# Patient Record
Sex: Female | Born: 1975 | Race: White | Hispanic: No | Smoking: Never smoker
Health system: Southern US, Community
[De-identification: ages and names within clinical notes are randomized; demographics above are authoritative.]

## PROBLEM LIST (undated history)

## (undated) DIAGNOSIS — F988 Other specified behavioral and emotional disorders with onset usually occurring in childhood and adolescence: Secondary | ICD-10-CM

## (undated) DIAGNOSIS — N390 Urinary tract infection, site not specified: Secondary | ICD-10-CM

## (undated) HISTORY — DX: Urinary tract infection, site not specified: N39.0

## (undated) HISTORY — DX: Other specified behavioral and emotional disorders with onset usually occurring in childhood and adolescence: F98.8

---
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
## 1999-01-18 ENCOUNTER — Encounter: Payer: Self-pay | Admitting: Urology

## 1999-01-18 ENCOUNTER — Encounter: Admission: RE | Admit: 1999-01-18 | Discharge: 1999-01-18 | Payer: Self-pay | Admitting: Urology

## 2002-03-04 HISTORY — PX: OTHER SURGICAL HISTORY: SHX169

## 2003-03-05 HISTORY — PX: APPENDECTOMY: SHX54

## 2007-03-05 HISTORY — PX: OTHER SURGICAL HISTORY: SHX169

## 2009-01-02 ENCOUNTER — Ambulatory Visit: Payer: Self-pay | Admitting: Family Medicine

## 2009-01-03 LAB — CONVERTED CEMR LAB
ALT: 12 units/L (ref 0–35)
AST: 14 units/L (ref 0–37)
Albumin: 4.3 g/dL (ref 3.5–5.2)
Alkaline Phosphatase: 32 units/L — ABNORMAL LOW (ref 39–117)
BUN: 14 mg/dL (ref 6–23)
CO2: 26 meq/L (ref 19–32)
Calcium: 9.2 mg/dL (ref 8.4–10.5)
Chloride: 106 meq/L (ref 96–112)
Cholesterol: 157 mg/dL (ref 0–200)
Creatinine, Ser: 0.66 mg/dL (ref 0.40–1.20)
Glucose, Bld: 81 mg/dL (ref 70–99)
HDL: 59 mg/dL (ref >39)
LDL Cholesterol: 87 mg/dL (ref 0–99)
Potassium: 3.7 meq/L (ref 3.5–5.3)
Sodium: 141 meq/L (ref 135–145)
Total Bilirubin: 0.6 mg/dL (ref 0.3–1.2)
Total CHOL/HDL Ratio: 2.7
Total Protein: 7.1 g/dL (ref 6.0–8.3)
Triglycerides: 56 mg/dL (ref <150)
VLDL: 11 mg/dL (ref 0–40)

## 2009-01-19 ENCOUNTER — Other Ambulatory Visit: Admission: RE | Admit: 2009-01-19 | Discharge: 2009-01-19 | Payer: Self-pay | Admitting: Family Medicine

## 2009-01-19 ENCOUNTER — Ambulatory Visit: Payer: Self-pay | Admitting: Family Medicine

## 2009-02-01 ENCOUNTER — Telehealth: Payer: Self-pay | Admitting: Family Medicine

## 2009-03-01 ENCOUNTER — Telehealth: Payer: Self-pay | Admitting: Family Medicine

## 2009-04-05 ENCOUNTER — Telehealth: Payer: Self-pay | Admitting: Family Medicine

## 2009-05-03 ENCOUNTER — Telehealth: Payer: Self-pay | Admitting: Family Medicine

## 2009-05-12 ENCOUNTER — Ambulatory Visit: Payer: Self-pay | Admitting: Family Medicine

## 2009-05-12 DIAGNOSIS — N390 Urinary tract infection, site not specified: Secondary | ICD-10-CM | POA: Insufficient documentation

## 2009-05-12 LAB — CONVERTED CEMR LAB
Glucose, Urine, Semiquant: NEGATIVE
Nitrite: NEGATIVE
Specific Gravity, Urine: 1.02
Urobilinogen, UA: 0.2
pH: 6

## 2009-05-13 ENCOUNTER — Encounter: Payer: Self-pay | Admitting: Family Medicine

## 2009-05-22 ENCOUNTER — Ambulatory Visit: Payer: Self-pay | Admitting: Family Medicine

## 2009-05-22 ENCOUNTER — Telehealth (INDEPENDENT_AMBULATORY_CARE_PROVIDER_SITE_OTHER): Payer: Self-pay | Admitting: *Deleted

## 2009-05-22 DIAGNOSIS — R300 Dysuria: Secondary | ICD-10-CM | POA: Insufficient documentation

## 2009-05-22 LAB — CONVERTED CEMR LAB
Bilirubin Urine: NEGATIVE
Blood in Urine, dipstick: NEGATIVE
Glucose, Urine, Semiquant: NEGATIVE
Ketones, urine, test strip: NEGATIVE
Nitrite: NEGATIVE
Protein, U semiquant: NEGATIVE
Specific Gravity, Urine: 1.02
Urobilinogen, UA: 0.2
WBC Urine, dipstick: NEGATIVE
pH: 7

## 2009-06-02 ENCOUNTER — Telehealth: Payer: Self-pay | Admitting: Family Medicine

## 2009-07-05 ENCOUNTER — Telehealth: Payer: Self-pay | Admitting: Family Medicine

## 2009-08-04 ENCOUNTER — Telehealth: Payer: Self-pay | Admitting: Family Medicine

## 2009-09-05 ENCOUNTER — Telehealth: Payer: Self-pay | Admitting: Family Medicine

## 2009-10-06 ENCOUNTER — Telehealth: Payer: Self-pay | Admitting: Family Medicine

## 2009-11-08 ENCOUNTER — Telehealth: Payer: Self-pay | Admitting: Family Medicine

## 2009-12-07 ENCOUNTER — Telehealth: Payer: Self-pay | Admitting: Family Medicine

## 2010-01-05 ENCOUNTER — Telehealth: Payer: Self-pay | Admitting: Family Medicine

## 2010-01-09 ENCOUNTER — Ambulatory Visit: Payer: Self-pay | Admitting: Family Medicine

## 2010-01-09 ENCOUNTER — Other Ambulatory Visit: Admission: RE | Admit: 2010-01-09 | Discharge: 2010-01-09 | Payer: Self-pay | Admitting: Family Medicine

## 2010-01-09 LAB — CONVERTED CEMR LAB: Pap Smear: NORMAL

## 2010-01-09 LAB — HM PAP SMEAR

## 2010-01-12 LAB — CONVERTED CEMR LAB: Pap Smear: NEGATIVE

## 2010-02-06 ENCOUNTER — Telehealth: Payer: Self-pay | Admitting: Family Medicine

## 2010-03-08 ENCOUNTER — Telehealth (INDEPENDENT_AMBULATORY_CARE_PROVIDER_SITE_OTHER): Payer: Self-pay | Admitting: *Deleted

## 2010-04-03 NOTE — Progress Notes (Signed)
Summary: Frequency and pressure  Phone Note Call from Patient   Caller: Patient Summary of Call: Pt states she finished ABX Friday and is still having frequency and pressure. Please advise. Initial call taken by: [name] CMA,  May 22, 2009 1:39 PM  Follow-up for Phone Call        have her come back today or tomorrow to recollect a clean catch UA. Follow-up by: Seymour Bars DO,  May 22, 2009 1:43 PM  Additional Follow-up for Phone Call Additional follow up Details #1::        pt aware Additional Follow-up by: [name] CMA,  May 22, 2009 1:57 PM

## 2010-04-03 NOTE — Letter (Signed)
Summary: Generic Letter  [facility] [place]  [address], [address]   [place], [place] [address]   Phone: [phone]  Fax: [phone]    01/09/2010  Madalaine Sabol [address] [place], [place]  [address]   To Whom It May Concern,  Ms.  Kodi Guerrera recieved her influenza vaccine today during her complete physical exam.     Thank You.       Sincerely,    Seymour Bars DO

## 2010-04-03 NOTE — Progress Notes (Signed)
Summary: Vyvance refill  Phone Note Refill Request Message from:  Patient on Jul 05, 2009 9:55 AM  Refills Requested: Medication #1:  VYVANSE 50 MG CAPS Take 1 cap by mouth once daily Initial call taken by: [name] CMA,  Jul 05, 2009 9:55 AM    Prescriptions: VYVANSE 50 MG CAPS (LISDEXAMFETAMINE DIMESYLATE) Take 1 cap by mouth once daily  #30 x 0   Entered and Authorized by:   Seymour Bars DO   Signed by:   Seymour Bars DO on 07/05/2009   Method used:   Print then Give to Patient   RxID:   [id number]

## 2010-04-03 NOTE — Assessment & Plan Note (Signed)
Summary: UA//mpm  Nurse Visit   Allergies: 1)  ! Vicodin Laboratory Results   Urine Tests    Routine Urinalysis   Color: yellow Appearance: Clear Glucose: negative   (Normal Range: Negative) Bilirubin: negative   (Normal Range: Negative) Ketone: negative   (Normal Range: Negative) Spec. Gravity: 1.020   (Normal Range: 1.003-1.035) Blood: negative   (Normal Range: Negative) pH: 7.0   (Normal Range: 5.0-8.0) Protein: negative   (Normal Range: Negative) Urobilinogen: 0.2   (Normal Range: 0-1) Nitrite: negative   (Normal Range: Negative) Leukocyte Esterace: negative   (Normal Range: Negative)       Orders Added: 1)  UA Dipstick w/o Micro (automated)  [81003] 2)  Urology Referral [Urology]      Impression & Recommendations:  Problem # 1:  UTI (ICD-599.0) UA is NORMAL today.  Since she remains symptomatic, will get her in with urology.   Her updated medication list for this problem includes:    Macrobid 100 Mg Caps (Nitrofurantoin monohyd macro) .Marland Kitchen... Take as needed    Cephalexin 500 Mg Caps (Cephalexin) .Marland Kitchen... 1 capsule by mouth three times a day x 7 days  Orders: UA Dipstick w/o Micro (automated)  (81003)  Encouraged to push clear liquids, get enough rest, and take acetaminophen as needed. To be seen in 10 days if no improvement, sooner if worse.  Complete Medication List: 1)  Vyvanse 50 Mg Caps (Lisdexamfetamine dimesylate) .... Take 1 cap by mouth once daily 2)  Macrobid 100 Mg Caps (Nitrofurantoin monohyd macro) .... Take as needed 3)  Tri-sprintec 0.18/0.215/0.25 Mg-35 Mcg Tabs (Norgestim-eth estrad triphasic) .Marland Kitchen.. 1 tab by mouth daily as directed 4)  Cephalexin 500 Mg Caps (Cephalexin) .Marland Kitchen.. 1 capsule by mouth three times a day x 7 days  Other Orders: Urology Referral (Urology)   Appended Document: UA//mpm Pt aware

## 2010-04-03 NOTE — Progress Notes (Signed)
Summary: Vyvanse refill  Phone Note Refill Request Message from:  Patient on June 02, 2009 3:47 PM  Refills Requested: Medication #1:  VYVANSE 50 MG CAPS Take 1 cap by mouth once daily   Supply Requested: 1 month pick up on Monday   Method Requested: Pick up at Office Initial call taken by: Kathlene November,  June 02, 2009 3:47 PM    Prescriptions: VYVANSE 50 MG CAPS (LISDEXAMFETAMINE DIMESYLATE) Take 1 cap by mouth once daily  #30 x 0   Entered and Authorized by:   Seymour Bars DO   Signed by:   Seymour Bars DO on 06/05/2009   Method used:   Print then Give to Patient   RxID:   [id number]

## 2010-04-03 NOTE — Progress Notes (Signed)
Summary: Vyvanse refill  Phone Note Refill Request Message from:  Patient on December 07, 2009 8:24 AM  Refills Requested: Medication #1:  VYVANSE 50 MG CAPS Take 1 cap by mouth once daily   Supply Requested: 1 month Call pt when ready to pick up at [phone]   Method Requested: Pick up at Office Initial call taken by: Kathlene November LPN,  December 07, 2009 8:24 AM    Prescriptions: VYVANSE 50 MG CAPS (LISDEXAMFETAMINE DIMESYLATE) Take 1 cap by mouth once daily  #30 x 0   Entered and Authorized by:   Seymour Bars DO   Signed by:   Seymour Bars DO on 12/07/2009   Method used:   Print then Give to Patient   RxID:   [id number]

## 2010-04-03 NOTE — Progress Notes (Signed)
Summary: Vyvance refill  Phone Note Refill Request Message from:  Patient on February 06, 2010 12:41 PM  Refills Requested: Medication #1:  VYVANSE 50 MG CAPS Take 1 cap by mouth once daily Initial call taken by: [name] CMA,  February 06, 2010 12:41 PM    Prescriptions: VYVANSE 50 MG CAPS (LISDEXAMFETAMINE DIMESYLATE) Take 1 cap by mouth once daily  #30 x 0   Entered and Authorized by:   Seymour Bars DO   Signed by:   Seymour Bars DO on 02/06/2010   Method used:   Print then Give to Patient   RxID:   [id number]

## 2010-04-03 NOTE — Assessment & Plan Note (Signed)
Summary: UTI/   Vital Signs:  Patient profile:   35 year old female Menstrual status:  regular Height:      67 inches Weight:      144 pounds BMI:     22.64 O2 Sat:      100 % on Room air Temp:     98.5 degrees F oral Pulse rate:   106 / minute BP sitting:   131 / 83  (left arm) Cuff size:   regular  Vitals Entered By: [name] CMA (May 12, 2009 9:58 AM)  O2 Flow:  Room air CC: ? UTI x 1+ weeks. Pressure and lower L sided back pain.    Primary Care Provider:  Seymour Bars DO  CC:  ? UTI x 1+ weeks. Pressure and lower L sided back pain. Marland Kitchen  History of Present Illness: 35 yo WF presents for L flank pain that started last wk.  Now, she has urinary pressure, urgency, frequency.  Denies dysuria.  No gross hematuria.  No fevers.  Has some nausea and no appetite.  Denies any abdominal pain.  Her flank pain is a constant dull throb.  No vaginal discharge or itching.  Her next period is due in about 10 days.  Hx of recurrent UTI, was seeing Dr Archer Asa and using postcoital Macrobid.   Current Medications (verified): 1)  Vyvanse 50 Mg Caps (Lisdexamfetamine Dimesylate) .... Take 1 Cap By Mouth Once Daily 2)  Macrobid 100 Mg Caps (Nitrofurantoin Monohyd Macro) .... Take As Needed 3)  Tri-Sprintec 0.18/0.215/0.25 Mg-35 Mcg Tabs (Norgestim-Eth Estrad Triphasic) .Marland Kitchen.. 1 Tab By Mouth Daily As Directed  Allergies (verified): 1)  ! Vicodin  Past History:  Past Medical History: Reviewed history from 01/02/2009 and no changes required. ADD G1P1 recurrent UTI -- Dr Archer Asa  Social History: Reviewed history from 01/02/2009 and no changes required. 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 Married to [place].  Has a 19 yo son. Never smoked.  Denies ETOH. Does Zumba for exrcise.  health diet.  Review of Systems      See HPI  Physical Exam  General:  alert, well-developed, well-nourished, and well-hydrated.   Head:  normocephalic and atraumatic.     Eyes:  sclera non icteric Mouth:  pharynx pink and moist.   Neck:  no masses.   Lungs:  Normal respiratory effort, chest expands symmetrically. Lungs are clear to auscultation, no crackles or wheezes. Heart:  Normal rate and regular rhythm. S1 and S2 normal without gallop, murmur, click, rub or other extra sounds. Abdomen:  soft; suprapubic TTP with vol guarding.  L CVAT.  No HSM.   Extremities:  no LE edema Skin:  color normal.   Cervical Nodes:  No lymphadenopathy noted   Impression & Recommendations:  Problem # 1:  UTI (ICD-599.0) Small LEs on UA.  Sent for cx. Empicially start on Keflex given hx of recurrent UTI and postcoital use of Macrobid. Call if not improving in the next 72 hrs.  Will f/u cx results next wk. Her updated medication list for this problem includes:    Macrobid 100 Mg Caps (Nitrofurantoin monohyd macro) .Marland Kitchen... Take as needed    Cephalexin 500 Mg Caps (Cephalexin) .Marland Kitchen... 1 capsule by mouth three times a day x 7 days  Orders: UA Dipstick w/o Micro (automated)  (81003) T-Culture, Urine ([id number])  Encouraged to push clear liquids, get enough rest, and take acetaminophen as needed. To be seen in 10 days if no improvement, sooner if  worse.  Complete Medication List: 1)  Vyvanse 50 Mg Caps (Lisdexamfetamine dimesylate) .... Take 1 cap by mouth once daily 2)  Macrobid 100 Mg Caps (Nitrofurantoin monohyd macro) .... Take as needed 3)  Tri-sprintec 0.18/0.215/0.25 Mg-35 Mcg Tabs (Norgestim-eth estrad triphasic) .Marland Kitchen.. 1 tab by mouth daily as directed 4)  Cephalexin 500 Mg Caps (Cephalexin) .Marland Kitchen.. 1 capsule by mouth three times a day x 7 days  Patient Instructions: 1)  Take Keflex for UTI . 2)  Will call you w/ culture results on Mon or Tuesday. 3)  Drink lots of water/ cranberry juice. Prescriptions: CEPHALEXIN 500 MG CAPS (CEPHALEXIN) 1 capsule by mouth three times a day x 7 days  #21 x 0   Entered and Authorized by:   Seymour Bars DO   Signed by:   Seymour Bars  DO on 05/12/2009   Method used:   Electronically to        [facility]* (retail)       [address] [place], [place]  [address]       Ph: [phone]       Fax: [phone]   RxID:   [id number]   Laboratory Results   Urine Tests    Routine Urinalysis   Color: yellow Appearance: Clear Glucose: negative   (Normal Range: Negative) Bilirubin: small   (Normal Range: Negative) Ketone: trace (5)   (Normal Range: Negative) Spec. Gravity: 1.020   (Normal Range: 1.003-1.035) Blood: trace-intact   (Normal Range: Negative) pH: 6.0   (Normal Range: 5.0-8.0) Protein: trace   (Normal Range: Negative) Urobilinogen: 0.2   (Normal Range: 0-1) Nitrite: negative   (Normal Range: Negative) Leukocyte Esterace: trace   (Normal Range: Negative)

## 2010-04-03 NOTE — Progress Notes (Signed)
Summary: Vyvance refill  Phone Note Refill Request   Refills Requested: Medication #1:  VYVANSE 50 MG CAPS Take 1 cap by mouth once daily Initial call taken by: [name] CMA,  May 03, 2009 1:52 PM    Prescriptions: VYVANSE 50 MG CAPS (LISDEXAMFETAMINE DIMESYLATE) Take 1 cap by mouth once daily  #30 x 0   Entered and Authorized by:   Seymour Bars DO   Signed by:   Seymour Bars DO on 05/03/2009   Method used:   Print then Give to Patient   RxID:   [id number]   Appended Document: Vyvance refill pt aware

## 2010-04-03 NOTE — Progress Notes (Signed)
Summary: Vyvance refills  Phone Note Refill Request   Refills Requested: Medication #1:  VYVANSE 50 MG CAPS Take 1 cap by mouth once daily Initial call taken by: [name] CMA,  November 08, 2009 8:23 AM    Prescriptions: VYVANSE 50 MG CAPS (LISDEXAMFETAMINE DIMESYLATE) Take 1 cap by mouth once daily  #30 x 0   Entered and Authorized by:   Seymour Bars DO   Signed by:   Seymour Bars DO on 11/08/2009   Method used:   Print then Give to Patient   RxID:   [id number]

## 2010-04-03 NOTE — Progress Notes (Signed)
Summary: Vyvanse refill  Phone Note Refill Request Message from:  Patient on August 04, 2009 1:42 PM  Refills Requested: Medication #1:  VYVANSE 50 MG CAPS Take 1 cap by mouth once daily   Supply Requested: 1 month Will pick up on monday   Method Requested: Pick up at Office Initial call taken by: Kathlene November,  August 04, 2009 1:42 PM    Prescriptions: VYVANSE 50 MG CAPS (LISDEXAMFETAMINE DIMESYLATE) Take 1 cap by mouth once daily  #30 x 0   Entered and Authorized by:   Seymour Bars DO   Signed by:   Seymour Bars DO on 08/04/2009   Method used:   Print then Give to Patient   RxID:   [id number]

## 2010-04-03 NOTE — Assessment & Plan Note (Signed)
Summary: CPE with pap   Vital Signs:  Patient profile:   35 year old female Menstrual status:  regular Height:      67 inches Weight:      145 pounds BMI:     22.79 O2 Sat:      100 % on Room air Pulse rate:   67 / minute BP sitting:   122 / 83  (left arm) Cuff size:   regular  Vitals Entered By: [name] CMA (January 09, 2010 9:42 AM)  O2 Flow:  Room air CC: CPE w/ pap   Primary Care Monica Santiago:  Seymour Bars DO  CC:  CPE w/ pap.  History of Present Illness: 35 yo G1P1 WF presents for CPE with pap smear.  Previously healthy, married, monogamous with no hx of abnormal pap smear.  She has regular light menses on her BCPs which she wants to stay on.  Denies fam hx of breast or premature colon cancer or heart dz.  She is physically active and eats healthy.  Due for flu shot.  Had Tdap in 2010.  Feels well.    Current Medications (verified): 1)  Vyvanse 50 Mg Caps (Lisdexamfetamine Dimesylate) .... Take 1 Cap By Mouth Once Daily 2)  Macrobid 100 Mg Caps (Nitrofurantoin Monohyd Macro) .... Take As Needed 3)  Tri-Sprintec 0.18/0.215/0.25 Mg-35 Mcg Tabs (Norgestim-Eth Estrad Triphasic) .Marland Kitchen.. 1 Tab By Mouth Daily As Directed  Allergies (verified): 1)  ! Vicodin  Past History:  Past Medical History: Reviewed history from 01/02/2009 and no changes required. ADD G1P1 recurrent UTI -- Dr Archer Asa  Past Surgical History: Reviewed history from 01/02/2009 and no changes required. appy 2005 tonsilectomy 1984 LTCS 2004 cervical polyp removed 2009  Family History: Reviewed history from 01/02/2009 and no changes required. father healthy brother healthy mother HTN, high chol, bladder and skin cancer Grandparents: colon cancer, kidney cancer, pancreatic cancer, congenital heart dz  Social History: Reviewed history from 01/02/2009 and no changes required. 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 Married to Monica Santiago.  Has a 35 yo son. Never smoked.   Denies ETOH. Does Zumba for exrcise.  health diet.  Review of Systems  The patient denies anorexia, fever, weight loss, weight gain, vision loss, decreased hearing, hoarseness, chest pain, syncope, dyspnea on exertion, peripheral edema, prolonged cough, headaches, hemoptysis, abdominal pain, melena, hematochezia, severe indigestion/heartburn, hematuria, incontinence, genital sores, muscle weakness, suspicious skin lesions, transient blindness, difficulty walking, depression, unusual weight change, abnormal bleeding, enlarged lymph nodes, angioedema, breast masses, and testicular masses.    Physical Exam  General:  alert, well-developed, well-nourished, and well-hydrated.   Head:  normocephalic and atraumatic.   Eyes:  pupils equal, pupils round, and pupils reactive to light.   Ears:  no external deformities.   Nose:  no nasal discharge.   Mouth:  good dentition and pharynx pink and moist.   Neck:  supple and no masses.   Breasts:  No mass, nodules, thickening, tenderness, bulging, retraction, inflamation, nipple discharge or skin changes noted.   Lungs:  Normal respiratory effort, chest expands symmetrically. Lungs are clear to auscultation, no crackles or wheezes. Heart:  Normal rate and regular rhythm. S1 and S2 normal without gallop, murmur, click, rub or other extra sounds. Abdomen:  Bowel sounds positive,abdomen soft and non-tender without masses, organomegaly  Genitalia:  Pelvic Exam:        External: normal female genitalia without lesions or masses        Vagina: normal without  lesions or masses        Cervix: normal without lesions or masses        Adnexa: normal bimanual exam without masses or fullness        Uterus: normal by palpation        Pap smear: performed Pulses:  2+ radial and pedal pulses Extremities:  no E/C/C Skin:  color normal and no suspicious lesions.   Cervical Nodes:  No lymphadenopathy noted Psych:  good eye contact, not anxious appearing, and not  depressed appearing.     Impression & Recommendations:  Problem # 1:  Gynecological examination-routine (ICD-V72.31) Keeping healthy checklist for women reviewed. BP at goal.  BMI at goal. Thin prep pap smear done. Fasting labs normal 01-2009, repeat in 2012. Flu shot given.  Tdap done 2010. BCPs refilled. Keep up good work iwth healthy diet, regular exercise.  Add MVI daily and Calcium with D daily.  Complete Medication List: 1)  Vyvanse 50 Mg Caps (Lisdexamfetamine dimesylate) .... Take 1 cap by mouth once daily 2)  Macrobid 100 Mg Caps (Nitrofurantoin monohyd macro) .... Take as needed 3)  Tri-sprintec 0.18/0.215/0.25 Mg-35 Mcg Tabs (Norgestim-eth estrad triphasic) .Marland Kitchen.. 1 tab by mouth daily as directed  Patient Instructions: 1)  Return for f/u ADD in 6 mos. Prescriptions: TRI-SPRINTEC 0.18/0.215/0.25 MG-35 MCG TABS (NORGESTIM-ETH ESTRAD TRIPHASIC) 1 tab by mouth daily as directed  #1 month x 12   Entered and Authorized by:   Seymour Bars DO   Signed by:   Seymour Bars DO on 01/09/2010   Method used:   Electronically to        [facility]* (retail)       [address] [place], [place]  [address]       Ph: [phone]       Fax: [phone]   RxID:   [id number]    Orders Added: 1)  Est. Patient age 36-39 [phone]

## 2010-04-03 NOTE — Progress Notes (Signed)
Summary: Vyvance refill  Phone Note Refill Request   Refills Requested: Medication #1:  VYVANSE 50 MG CAPS Take 1 cap by mouth once daily Initial call taken by: [name] CMA,  October 06, 2009 10:26 AM    Prescriptions: VYVANSE 50 MG CAPS (LISDEXAMFETAMINE DIMESYLATE) Take 1 cap by mouth once daily  #30 x 0   Entered and Authorized by:   Seymour Bars DO   Signed by:   Seymour Bars DO on 10/06/2009   Method used:   Print then Give to Patient   RxID:   [id number]

## 2010-04-03 NOTE — Progress Notes (Signed)
Summary: PT NEEDS HER VYVANSE RX...  Phone Note Refill Request Message from:  Patient on September 05, 2009 10:39 AM  Refills Requested: Medication #1:  VYVANSE 50 MG CAPS Take 1 cap by mouth once daily PT WILL PICK UP HERE AT THE OFFICE [facility] 09/06/09  Initial call taken by: Michaelle Copas,  September 05, 2009 10:39 AM    Prescriptions: VYVANSE 50 MG CAPS (LISDEXAMFETAMINE DIMESYLATE) Take 1 cap by mouth once daily  #30 x 0   Entered and Authorized by:   Seymour Bars DO   Signed by:   Seymour Bars DO on 09/05/2009   Method used:   Print then Give to Patient   RxID:   [id number]   Appended Document: PT NEEDS HER VYVANSE RX... attempt to call- ans mach - LMTCB if questions - RX ready for pick up . KIK

## 2010-04-03 NOTE — Progress Notes (Signed)
Summary: refill request -jr  Phone Note Refill Request Message from:  Patient on December 07, 2009 1:07 PM  Refills Requested: Medication #1:  VYVANSE 50 MG CAPS Take 1 cap by mouth once daily Call pt when this ready for pick up... [phone].Michaelle Copas  December 07, 2009 1:08 PM   Initial call taken by: Michaelle Copas,  December 07, 2009 1:08 PM  Follow-up for Phone Call        I printed already today.  Marcelino Duster may have it. Follow-up by: Seymour Bars DO,  December 07, 2009 1:49 PM

## 2010-04-03 NOTE — Progress Notes (Signed)
Summary: refill  Phone Note Refill Request Message from:  Patient on January 05, 2010 1:33 PM  Refills Requested: Medication #1:  VYVANSE 50 MG CAPS Take 1 cap by mouth once daily   Supply Requested: 1 month Pt will pick up on Monday   Method Requested: Pick up at Office Initial call taken by: Kathlene November LPN,  January 05, 2010 1:33 PM    Prescriptions: VYVANSE 50 MG CAPS (LISDEXAMFETAMINE DIMESYLATE) Take 1 cap by mouth once daily  #30 x 0   Entered and Authorized by:   Seymour Bars DO   Signed by:   Seymour Bars DO on 01/05/2010   Method used:   Print then Give to Patient   RxID:   [id number]

## 2010-04-03 NOTE — Progress Notes (Signed)
Summary: Rx refill  Phone Note Call from Patient   Caller: Patient Summary of Call: Dr.Marrian Bells  Patient needs a Rx for Vyvanse 50mg  Initial call taken by: Vanessa Swaz[place],  April 05, 2009 8:04 AM    Prescriptions: VYVANSE 50 MG CAPS (LISDEXAMFETAMINE DIMESYLATE) Take 1 cap by mouth once daily  #30 x 0   Entered and Authorized by:   Seymour Bars DO   Signed by:   Seymour Bars DO on 04/05/2009   Method used:   Print then Give to Patient   RxID:   [id number]

## 2010-04-05 NOTE — Progress Notes (Signed)
Summary: PRESCRIPTION RE-FILL  Phone Note Call from Patient   Caller: Patient Summary of Call: PT CALLED ADVISED NEED PRESCRIPTION RE-FILL ON VIVANCE 50MG  AND SHE NEEDS TO PICK UP PRESCRIPTION [phone] Initial call taken by: Janeal Holmes,  March 08, 2010 12:45 PM    Prescriptions: VYVANSE 50 MG CAPS (LISDEXAMFETAMINE DIMESYLATE) Take 1 cap by mouth once daily  #30 x 0   Entered by:   [name] CMA   Authorized by:   Nani Gasser MD   Signed by:   [name] CMA on 03/08/2010   Method used:   Print then Give to Patient   RxID:   [id number]

## 2010-04-09 ENCOUNTER — Telehealth: Payer: Self-pay | Admitting: Family Medicine

## 2010-04-19 NOTE — Progress Notes (Signed)
Summary: [facility]-Refill vyvanse  Phone Note Refill Request Message from:  Patient  Refills Requested: Medication #1:  VYVANSE 50 MG CAPS Take 1 cap by mouth once daily   Dosage confirmed as above?Dosage Confirmed   Supply Requested: 1 month   Last Refilled: 03/08/2010 please call when ready   Method Requested: Pick up at Office Initial call taken by: Francee Piccolo CMA (AAMA),  April 09, 2010 1:42 PM    Prescriptions: VYVANSE 50 MG CAPS (LISDEXAMFETAMINE DIMESYLATE) Take 1 cap by mouth once daily  #30 x 0   Entered and Authorized by:   Seymour Bars DO   Signed by:   Seymour Bars DO on 04/09/2010   Method used:   Print then Give to Patient   RxID:   [id number]   Appended Document: [facility]-Refill vyvanse notified rx ready.

## 2010-05-08 ENCOUNTER — Telehealth (INDEPENDENT_AMBULATORY_CARE_PROVIDER_SITE_OTHER): Payer: Self-pay | Admitting: *Deleted

## 2010-05-15 NOTE — Progress Notes (Signed)
Summary: Vyvanse Refill  Phone Note Refill Request Call back at [phone] Message from:  Patient on May 08, 2010 11:46 AM  Refills Requested: Medication #1:  VYVANSE 50 MG CAPS Take 1 cap by mouth once daily   Brand Name Necessary? No   Supply Requested: 1 month pt to pick up written Rx   Method Requested: Pick up at Office Initial call taken by: Lannette Donath,  May 08, 2010 11:46 AM    Prescriptions: VYVANSE 50 MG CAPS (LISDEXAMFETAMINE DIMESYLATE) Take 1 cap by mouth once daily  #30 x 0   Entered and Authorized by:   [name] CMA   Signed by:   [name] CMA on 05/08/2010   Method used:   Print then Give to Patient   RxID:   [id number]

## 2010-06-06 ENCOUNTER — Other Ambulatory Visit: Payer: Self-pay | Admitting: *Deleted

## 2010-06-06 MED ORDER — LISDEXAMFETAMINE DIMESYLATE 50 MG PO CAPS: 50.0000 mg | ORAL_CAPSULE | ORAL | Status: DC

## 2010-06-06 NOTE — Telephone Encounter (Signed)
Pt requests refill of Vyvanse 50 mg.  Med was last filled on 05/08/10.  Pt would like phone call when Rx is ready to pick up.  We are closed on 4/6 so pt will have to pick up by tomorrow.

## 2010-07-02 ENCOUNTER — Encounter: Payer: Self-pay | Admitting: Family Medicine

## 2010-07-03 ENCOUNTER — Encounter: Payer: Self-pay | Admitting: Family Medicine

## 2010-07-03 ENCOUNTER — Ambulatory Visit (INDEPENDENT_AMBULATORY_CARE_PROVIDER_SITE_OTHER): Payer: BC Managed Care – PPO | Admitting: Family Medicine

## 2010-07-03 VITALS — BP 114/79 | HR 91 | Ht 67.0 in | Wt 145.0 lb

## 2010-07-03 DIAGNOSIS — F988 Other specified behavioral and emotional disorders with onset usually occurring in childhood and adolescence: Secondary | ICD-10-CM

## 2010-07-03 MED ORDER — LISDEXAMFETAMINE DIMESYLATE 50 MG PO CAPS: 50.0000 mg | ORAL_CAPSULE | ORAL | Status: DC

## 2010-07-03 NOTE — Patient Instructions (Signed)
Stay on current meds.  Call me if any problems.  Return for f/u in 6 mos.

## 2010-07-03 NOTE — Progress Notes (Signed)
  Subjective:    Patient ID: Monica Santiago, female    DOB: [birth year], 35 y.o.   MRN: [id number]  HPI 35 yo WF presents for ADD f/u. She was started on Vyvanse 2 yrs ago and she has really noticed an improvement in focus and concentration.  Denies trouble sleeping at night.  Denies anorexia or heart palpitations.  Denies any tremors.  She is happy on the current dose.  She is able to focus on work and is helping her son do homework much better.    BP 114/79  Pulse 91  Ht 5\' 7"  (1.702 m)  Wt 145 lb (65.772 kg)  BMI 22.71 kg/m2  SpO2 100%     Review of Systems as per HPI     Objective:   Physical Exam  Constitutional: She appears well-developed and well-nourished.  Neck: No thyromegaly present.  Cardiovascular: Normal rate, regular rhythm and normal heart sounds.   No murmur heard. Pulmonary/Chest: Effort normal and breath sounds normal. No respiratory distress.  Neurological:       No tremor  Skin: Skin is warm and dry.  Psychiatric: She has a normal mood and affect.          Assessment & Plan:

## 2010-07-03 NOTE — Assessment & Plan Note (Signed)
ADD - doing great on Vyvanse 50 mg qAM w/o adverse SE.  BP/ HR looks great.  Continue/ RF at current dose and f/u in 6 mos.

## 2010-07-16 ENCOUNTER — Encounter: Payer: Self-pay | Admitting: Family Medicine

## 2010-07-16 ENCOUNTER — Ambulatory Visit
Admission: RE | Admit: 2010-07-16 | Discharge: 2010-07-16 | Disposition: A | Discharge status: Home / Self Care | Payer: BC Managed Care – PPO | Source: Ambulatory Visit | Attending: Family Medicine | Admitting: Family Medicine

## 2010-07-16 ENCOUNTER — Ambulatory Visit (INDEPENDENT_AMBULATORY_CARE_PROVIDER_SITE_OTHER): Payer: BC Managed Care – PPO | Admitting: Family Medicine

## 2010-07-16 ENCOUNTER — Telehealth: Payer: Self-pay | Admitting: Family Medicine

## 2010-07-16 VITALS — BP 127/86 | HR 88 | Ht 67.0 in | Wt 145.0 lb

## 2010-07-16 DIAGNOSIS — M5430 Sciatica, unspecified side: Secondary | ICD-10-CM

## 2010-07-16 DIAGNOSIS — M5431 Sciatica, right side: Secondary | ICD-10-CM

## 2010-07-16 MED ORDER — METHOCARBAMOL 750 MG PO TABS: ORAL_TABLET | ORAL | Status: DC

## 2010-07-16 MED ORDER — METHYLPREDNISOLONE (PAK) 4 MG PO TABS: 4.0000 mg | ORAL_TABLET | Freq: Every day | ORAL | Status: DC

## 2010-07-16 NOTE — Progress Notes (Signed)
  Subjective:    Patient ID: Monica Santiago, female    DOB: August 03, 1975, 35 y.o.   MRN: [id number]  HPI  35 yo WF presents for LBP that started on Friday.  She was walking the dog and it felt tight.  On Sat, it became worse, shooting down the R leg.  Hurts to hit, stand.  Used heat, ice and ibuprofen.  She fell roller skating 3 yrs ago and had an isolated incidence of pain.  The pain goes all the way down to her foot.  No tingling or numbness.  Ibuprofen does not seem to be doing much even up to 800 mg.  Has 8/10 pain with standing, walking and is even having a hard time getting comfortable at night.   BP 127/86  Pulse 88  Ht 5\' 7"  (1.702 m)  Wt 145 lb (65.772 kg)  BMI 22.71 kg/m2  SpO2 100%     Review of Systems  Constitutional: Negative for fever and fatigue.  Gastrointestinal: Negative for nausea, vomiting, constipation and abdominal distention.  Genitourinary: Negative for urgency, frequency and difficulty urinating.  Musculoskeletal: Positive for myalgias, back pain and gait problem.       Objective:   Physical Exam  Constitutional: She appears well-developed and well-nourished. No distress.  Cardiovascular: Normal rate, regular rhythm and normal heart sounds.   Pulmonary/Chest: Effort normal and breath sounds normal.  Musculoskeletal:       Tender over R paraspinal muscles L4-S1 with R sided sciatic notch tenderness, antalgic gait, pain with full flexion and with little extension.  Neurological:       + bilat seated straight leg raise with pain over the R L spine  Skin: Skin is warm and dry.          Assessment & Plan:

## 2010-07-16 NOTE — Assessment & Plan Note (Signed)
New onset R sciatica without trauma or overuse.  Exam c/w herniated disc likely.  Will treat with Medrol Dose pack and Robaxin at night.  Use heat, ice and relative rest.  Xray today to look for DDD.  RTC for f/u in 2 wks.

## 2010-07-16 NOTE — Patient Instructions (Addendum)
For Sciatica, take Medrol Dose pack.  Hold all ibuprofen, advil, aleve, motrin while on this.  OK to use Tylenol.  Use heat/ ice.  Use Robaxin at night (muscle relaxer).  Xray back today. Will call you w/ results tomorrow.  Return for f/u in 2 wks if not completely resolved.  Lumbar Radiculopathy, Sciatica Sciatica is a weakness and/or changes in sensation (tingling, jolts, hot and cold, numbness) along the path the sciatic nerve travels. Irritation or damage to lumbar nerve roots is often also referred to as lumbar radiculopathy.  Lumbar radiculopathy (Sciatica) is the most common form of this problem. Radiculopathy can occur in any of the nerves coming out of the spinal cord. The problems caused depend on which nerves are involved. The sciatic nerve is the large nerve supplying the branches of nerves going from the hip to the toes. It often causes a numbness or weakness in the skin and/or muscles that the sciatic nerve serves. It also may cause symptoms (problems) of pain, burning, tingling, or electric shock-like feelings in the path of this nerve. This usually comes from injury to the fibers that make up the sciatic nerve. Some of these symptoms are low back pain and/or unpleasant feelings in the following areas:  From the mid-buttock down the back of the leg to the back of the knee.   And/or the outside of the calf and top of the foot.   And/or behind the inner ankle to the sole of the foot.  CAUSES  Herniated or slipped disc. Discs are the little cushions between the bones in the back.   Pressure by the piriformis muscle in the buttock on the sciatic nerve (Piriformis Syndrome).   Misalignment of the bones in the lower back and buttocks (Sacroiliac Joint Derangement).   Narrowing of the spinal canal that puts pressure on or pinches the fibers that make up the sciatic nerve.   A slipped vertebra that is out of line with those above or beneath it.   Abnormality of the nervous  system itself so that nerve fibers do not transmit signals properly, especially to feet and calves (neuropathy).   Tumor (this is rare).  Your caregiver can usually determine the cause of your sciatica and begin the treatment most likely to help you. TREATMENT Taking over-the-counter painkillers, physical therapy, rest, exercise, spinal manipulation, and injections of anesthetics and/or steroids may be used. Surgery, acupuncture, and Yoga can also be effective. Mind over matter techniques, mental imagery, and changing factors such as your bed, chair, desk height, posture, and activities are other treatments that may be helpful. You and your caregiver can help determine what is best for you. With proper diagnosis, the cause of most sciatica can be identified and removed. Communication and cooperation between your caregiver and you is essential. If you are not successful immediately, do not be discouraged. With time, a proper treatment can be found that will make you comfortable. HOME CARE INSTRUCTIONS  If the pain is coming from a problem in the back, applying ice to that area for 15 minutes, 4 times per day while awake, may be helpful. Put the ice in a plastic bag. Place a towel between the bag of ice and your skin.   You may exercise or perform your usual activities if these do not aggravate your pain, or as suggested by your caregiver.   Only take over-the-counter or prescription medicines for pain, discomfort, or fever as directed by your caregiver.   If your caregiver has given  you a follow-up appointment, it is very important to keep that appointment. Not keeping the appointment could result in a chronic or permanent injury, pain, and disability. If there is any problem keeping the appointment, you must call back to this facility for assistance.  SEEK IMMEDIATE MEDICAL CARE IF:  You experience loss of control of bowel or bladder.   You have increasing weakness in the trunk, buttocks, or legs.    There is numbness in any areas from the hip down to the toes.   You have difficulty walking or keeping your balance.   You have any of the above, with fever or forceful vomiting.  Document Released: 02/12/2001 Document Re-Released: 03/12/2009 [facility] Patient Information 2011 [place], [place].

## 2010-07-16 NOTE — Telephone Encounter (Signed)
Pls let pt know that good news - her back xray shows normal vertebra and discs.  No sign of degeneration or herniation.

## 2010-07-16 NOTE — Telephone Encounter (Signed)
LMOM informing Pt of the above 

## 2010-08-09 ENCOUNTER — Other Ambulatory Visit: Payer: Self-pay | Admitting: Family Medicine

## 2010-08-09 NOTE — Telephone Encounter (Signed)
Pt called and needs a script for vyvanse.  Call when ready to pick up. Routed to Dr. Arlice Colt, LPN [facility]

## 2010-08-13 ENCOUNTER — Other Ambulatory Visit: Payer: Self-pay | Admitting: Family Medicine

## 2010-08-13 DIAGNOSIS — F909 Attention-deficit hyperactivity disorder, unspecified type: Secondary | ICD-10-CM

## 2010-08-13 MED ORDER — LISDEXAMFETAMINE DIMESYLATE 50 MG PO CAPS: 50.0000 mg | ORAL_CAPSULE | ORAL | Status: DC

## 2010-08-13 NOTE — Telephone Encounter (Signed)
Pt called and stated she still needed the refill for vyvanse 50 mg.  Now, out of medication. Plan:  Printed off 30 day supply of Vyvanse 50 mg Po Qam and #30/ refills given to Dr. Linford Arnold to sign since Dr. Cathey Endow out of the office.  Pt will be notified to pup the script. Jarvis Newcomer, LPN [facility]

## 2010-08-21 MED ORDER — LISDEXAMFETAMINE DIMESYLATE 50 MG PO CAPS: 50.0000 mg | ORAL_CAPSULE | ORAL | Status: DC

## 2010-08-21 NOTE — Telephone Encounter (Signed)
done

## 2010-09-10 ENCOUNTER — Other Ambulatory Visit: Payer: Self-pay | Admitting: Family Medicine

## 2010-09-10 NOTE — Telephone Encounter (Signed)
Pt called for refill of her vyvanse 50 mg.  Last refill was on 08-21-10.  Pt is not due until 09-20-10.  Too early for refill. Plan:  LMOM explaining too early for refill.  Told to call back on 09-18-10 or 09-19-10 for refill. Jarvis Newcomer, LPN [facility]

## 2010-09-12 ENCOUNTER — Other Ambulatory Visit: Payer: Self-pay | Admitting: *Deleted

## 2010-10-12 ENCOUNTER — Telehealth: Payer: Self-pay | Admitting: Family Medicine

## 2010-10-12 MED ORDER — LISDEXAMFETAMINE DIMESYLATE 50 MG PO CAPS: 50.0000 mg | ORAL_CAPSULE | ORAL | Status: DC

## 2010-10-12 NOTE — Telephone Encounter (Signed)
Pt called for refill of her vyvanse 50 mg QD.  Had recent ADD fup in 07-03-10, and not due for follow up for 6 mths beyond that.  Refilled # 30 /0 refills and pt will pup. Jarvis Newcomer, LPN [facility]

## 2010-10-12 NOTE — Telephone Encounter (Signed)
Pt called for refill of vyvanse 50 mg.  Recent office visit and prescription refilled # 30/0refills. Jarvis Newcomer, LPN [facility]

## 2010-11-13 ENCOUNTER — Other Ambulatory Visit: Payer: Self-pay | Admitting: Family Medicine

## 2010-11-13 MED ORDER — LISDEXAMFETAMINE DIMESYLATE 50 MG PO CAPS: 50.0000 mg | ORAL_CAPSULE | ORAL | Status: DC

## 2010-11-13 NOTE — Telephone Encounter (Signed)
Pt requesting RF for vyvanse 50 mg.  Not due for FUP until 01-03-11. Plan:  RF given for #30/0 refills. Jarvis Newcomer, LPN [facility]

## 2010-12-13 ENCOUNTER — Telehealth: Payer: Self-pay | Admitting: Family Medicine

## 2010-12-13 MED ORDER — LISDEXAMFETAMINE DIMESYLATE 50 MG PO CAPS: 50.0000 mg | ORAL_CAPSULE | ORAL | Status: DC

## 2010-12-13 NOTE — Telephone Encounter (Signed)
Pt called for refill of her vyvanse 50 mg. Plan:  Authorized for #30/0 refills. Jarvis Newcomer, LPN [facility]

## 2011-01-11 ENCOUNTER — Ambulatory Visit (INDEPENDENT_AMBULATORY_CARE_PROVIDER_SITE_OTHER): Payer: BC Managed Care – PPO | Admitting: Family Medicine

## 2011-01-11 ENCOUNTER — Encounter: Payer: Self-pay | Admitting: Family Medicine

## 2011-01-11 VITALS — BP 113/78 | HR 79 | Ht 66.0 in | Wt 145.0 lb

## 2011-01-11 DIAGNOSIS — Z0000 Encounter for general adult medical examination without abnormal findings: Secondary | ICD-10-CM

## 2011-01-11 MED ORDER — LISDEXAMFETAMINE DIMESYLATE 50 MG PO CAPS: 50.0000 mg | ORAL_CAPSULE | ORAL | Status: DC

## 2011-01-11 NOTE — Patient Instructions (Signed)
Start a regular exercise program and make sure you are eating a healthy diet Try to eat 4 servings of dairy a day or take a calcium supplement (500mg twice a day). Your vaccines are up to date.   

## 2011-01-11 NOTE — Progress Notes (Signed)
  Subjective:     Monica Santiago is a 35 y.o. female and is here for a comprehensive physical exam. The patient reports problems - Notice  spot onthe dorsum of the right hand. Woke her up with pain one night. Has had to use IBU a couple fo times. Says normally no tender but occ feels pain after repetative movements.   History   Social History  . Marital Status: Married    Spouse Name: [name]    Number of Children: 1  . 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    Social History Main Topics  . Smoking status: Never Smoker   . Smokeless tobacco: Never Used  . Alcohol Use: No  . Drug Use: Not on file  . Sexually Active: Yes -- Female partner(s)   Other Topics Concern  . Not on file   Social History Narrative   Some regular exercise.    Health Maintenance  Topic Date Due  . Influenza Vaccine  12/03/2010  . Pap Smear  01/09/2013  . Tetanus/tdap  01/03/2019    The following portions of the patient's history were reviewed and updated as appropriate: allergies, current medications, past family history, past medical history, past social history, past surgical history and problem list.  Review of Systems A comprehensive review of systems was negative.   Objective:    BP 113/78  Pulse 79  Ht 5\' 6"  (1.676 m)  Wt 145 lb (65.772 kg)  BMI 23.40 kg/m2  SpO2 100%  LMP 01/11/2011 General appearance: alert, cooperative and appears stated age Head: Normocephalic, without obvious abnormality, atraumatic Eyes: conjunc clear, EOMi, PEERLA.  Ears: normal TM's and external ear canals both ears Nose: Nares normal. Septum midline. Mucosa normal. No drainage or sinus tenderness. Throat: lips, mucosa, and tongue normal; teeth and gums normal Neck: no adenopathy, no carotid bruit, supple, symmetrical, trachea midline and thyroid not enlarged, symmetric, no tenderness/mass/nodules Back: symmetric, no curvature. ROM normal. No CVA  tenderness. Lungs: clear to auscultation bilaterally Breasts: normal appearance, no masses or tenderness Heart: regular rate and rhythm, S1, S2 normal, no murmur, click, rub or gallop Abdomen: soft, non-tender; bowel sounds normal; no masses,  no organomegaly Extremities: extremities normal, atraumatic, no cyanosis or edema and she has what looks like a ganglion cyst on the dorsum of the right wrist.  Pulses: 2+ and symmetric Skin: Skin color, texture, turgor normal. No rashes or lesions Lymph nodes: Cervical, supraclavicular, and axillary nodes normal. Neurologic: Alert and oriented X 3, normal strength and tone. Normal symmetric reflexes. Normal coordination and gait    Assessment:    Healthy female exam.      Plan:     See After Visit Summary for Counseling Recommendations  Start a regular exercise program and make sure you are eating a healthy diet Try to eat 4 servings of dairy a day or take a calcium supplement (500mg  twice a day). Your vaccines are up to date.   Ganglion cyst on the hand. Call if having discomfort or getting larger and will refer to hand surgeon.

## 2011-01-12 LAB — COMPLETE METABOLIC PANEL WITH GFR
ALT: 8 U/L (ref 0–35)
AST: 16 U/L (ref 0–37)
Alkaline Phosphatase: 28 U/L — ABNORMAL LOW (ref 39–117)
GFR, Est Non African American: >89 mL/min (ref >89)
Sodium: 141 mEq/L (ref 135–145)
Total Bilirubin: 0.7 mg/dL (ref 0.3–1.2)
Total Protein: 6.6 g/dL (ref 6.0–8.3)

## 2011-01-12 LAB — LIPID PANEL
HDL: 60 mg/dL (ref >39)
LDL Cholesterol: 112 mg/dL — ABNORMAL HIGH (ref 0–99)
Total CHOL/HDL Ratio: 3.1 Ratio

## 2011-01-14 ENCOUNTER — Other Ambulatory Visit: Payer: Self-pay | Admitting: *Deleted

## 2011-01-14 MED ORDER — NORGESTIM-ETH ESTRAD TRIPHASIC 0.18/0.215/0.25 MG-35 MCG PO TABS: 1.0000 | ORAL_TABLET | Freq: Every day | ORAL | Status: DC

## 2011-02-11 ENCOUNTER — Other Ambulatory Visit: Payer: Self-pay | Admitting: *Deleted

## 2011-02-11 MED ORDER — LISDEXAMFETAMINE DIMESYLATE 50 MG PO CAPS: 50.0000 mg | ORAL_CAPSULE | ORAL | Status: DC

## 2011-03-14 ENCOUNTER — Other Ambulatory Visit: Payer: Self-pay | Admitting: *Deleted

## 2011-03-14 MED ORDER — LISDEXAMFETAMINE DIMESYLATE 50 MG PO CAPS: 50.0000 mg | ORAL_CAPSULE | ORAL | Status: DC

## 2011-04-15 ENCOUNTER — Other Ambulatory Visit: Payer: Self-pay | Admitting: *Deleted

## 2011-04-15 MED ORDER — LISDEXAMFETAMINE DIMESYLATE 50 MG PO CAPS: 50.0000 mg | ORAL_CAPSULE | ORAL | Status: DC

## 2011-05-16 ENCOUNTER — Other Ambulatory Visit: Payer: Self-pay | Admitting: *Deleted

## 2011-05-16 MED ORDER — LISDEXAMFETAMINE DIMESYLATE 50 MG PO CAPS: 50.0000 mg | ORAL_CAPSULE | ORAL | Status: DC

## 2011-06-17 ENCOUNTER — Other Ambulatory Visit: Payer: Self-pay | Admitting: *Deleted

## 2011-06-17 MED ORDER — LISDEXAMFETAMINE DIMESYLATE 50 MG PO CAPS: 50.0000 mg | ORAL_CAPSULE | ORAL | Status: DC

## 2011-07-04 ENCOUNTER — Encounter: Payer: Self-pay | Admitting: Family Medicine

## 2011-07-04 ENCOUNTER — Ambulatory Visit (INDEPENDENT_AMBULATORY_CARE_PROVIDER_SITE_OTHER): Payer: BC Managed Care – PPO | Admitting: Family Medicine

## 2011-07-04 VITALS — BP 111/73 | HR 74 | Ht 66.0 in | Wt 143.0 lb

## 2011-07-04 DIAGNOSIS — F909 Attention-deficit hyperactivity disorder, unspecified type: Secondary | ICD-10-CM

## 2011-07-04 MED ORDER — LISDEXAMFETAMINE DIMESYLATE 50 MG PO CAPS: 50.0000 mg | ORAL_CAPSULE | ORAL | Status: DC

## 2011-07-04 NOTE — Progress Notes (Signed)
  Subjective:    Patient ID: Monica Santiago, female    DOB: 18-Dec-1975, 36 y.o.   MRN: [id number]  HPI ADD - doing well.  Dong well on the vyvanse and makes a big difference.  Sleeping well. Has lost a couple of pounds from stress but she is eating.  No CP or SOB. No palpitations.     Review of Systems     Objective:   Physical Exam  Constitutional: She is oriented to person, place, and time. She appears well-developed and well-nourished.  HENT:  Head: Normocephalic and atraumatic.  Cardiovascular: Normal rate, regular rhythm and normal heart sounds.   Pulmonary/Chest: Effort normal and breath sounds normal.  Neurological: She is alert and oriented to person, place, and time.  Skin: Skin is warm and dry.  Psychiatric: She has a normal mood and affect. Her behavior is normal.          Assessment & Plan:  ADD - dong well. Refill her meds. F/U meds. BP is well controlled. Not causing any Sxs or S.E.  Please call if any concerns.

## 2011-08-16 ENCOUNTER — Other Ambulatory Visit: Payer: Self-pay | Admitting: *Deleted

## 2011-08-16 MED ORDER — LISDEXAMFETAMINE DIMESYLATE 50 MG PO CAPS: 50.0000 mg | ORAL_CAPSULE | ORAL | Status: DC

## 2011-09-16 ENCOUNTER — Other Ambulatory Visit: Payer: Self-pay | Admitting: *Deleted

## 2011-09-16 MED ORDER — LISDEXAMFETAMINE DIMESYLATE 50 MG PO CAPS: 50.0000 mg | ORAL_CAPSULE | ORAL | Status: DC

## 2011-10-17 ENCOUNTER — Other Ambulatory Visit: Payer: Self-pay | Admitting: *Deleted

## 2011-10-17 MED ORDER — LISDEXAMFETAMINE DIMESYLATE 50 MG PO CAPS: 50.0000 mg | ORAL_CAPSULE | ORAL | Status: DC

## 2011-11-18 ENCOUNTER — Other Ambulatory Visit: Payer: Self-pay | Admitting: *Deleted

## 2011-11-18 MED ORDER — LISDEXAMFETAMINE DIMESYLATE 50 MG PO CAPS: 50.0000 mg | ORAL_CAPSULE | ORAL | Status: DC

## 2011-12-05 ENCOUNTER — Other Ambulatory Visit (HOSPITAL_COMMUNITY)
Admission: RE | Admit: 2011-12-05 | Discharge: 2011-12-05 | Disposition: A | Discharge status: Home / Self Care | Payer: BC Managed Care – PPO | Source: Ambulatory Visit | Attending: Family Medicine | Admitting: Family Medicine

## 2011-12-05 ENCOUNTER — Encounter: Payer: Self-pay | Admitting: Family Medicine

## 2011-12-05 ENCOUNTER — Ambulatory Visit (INDEPENDENT_AMBULATORY_CARE_PROVIDER_SITE_OTHER): Payer: BC Managed Care – PPO | Admitting: Family Medicine

## 2011-12-05 VITALS — BP 111/72 | HR 83 | Ht 66.0 in | Wt 144.0 lb

## 2011-12-05 DIAGNOSIS — Z124 Encounter for screening for malignant neoplasm of cervix: Secondary | ICD-10-CM

## 2011-12-05 DIAGNOSIS — E785 Hyperlipidemia, unspecified: Secondary | ICD-10-CM

## 2011-12-05 DIAGNOSIS — Z23 Encounter for immunization: Secondary | ICD-10-CM

## 2011-12-05 DIAGNOSIS — F909 Attention-deficit hyperactivity disorder, unspecified type: Secondary | ICD-10-CM

## 2011-12-05 DIAGNOSIS — Z01419 Encounter for gynecological examination (general) (routine) without abnormal findings: Secondary | ICD-10-CM

## 2011-12-05 MED ORDER — NORGESTIM-ETH ESTRAD TRIPHASIC 0.18/0.215/0.25 MG-35 MCG PO TABS: 1.0000 | ORAL_TABLET | Freq: Every day | ORAL | Status: DC

## 2011-12-05 NOTE — Patient Instructions (Addendum)
Start a regular exercise program and make sure you are eating a healthy diet Try to eat 4 servings of dairy a day  Your vaccines are up to date.   

## 2011-12-05 NOTE — Progress Notes (Signed)
  Subjective:    Patient ID: Monica Santiago, female    DOB: Feb 01, 1976, 36 y.o.   MRN: [id number]  HPI Here for Pap smear only. She's doing very well. She's very happy with her birth control method. She still has periods every 28 days. No problems or difficulty or pelvic pain. She's never had an abnormal Pap smear that she has had an endometrial polyp about 4 years ago. She's not quite due for complete physical but it had been 2 years since her Pap so she is here for that today. She does need refills on the birth control. Blood pressures well regulated.   Review of Systems     Objective:   Physical Exam  Constitutional: She appears well-developed and well-nourished.  HENT:  Head: Normocephalic and atraumatic.  Genitourinary: Vagina normal and uterus normal. There is no rash or lesion on the right labia. There is no rash or lesion on the left labia. Cervix exhibits no motion tenderness and no discharge. Right adnexum displays no mass, no tenderness and no fullness. Left adnexum displays no mass, no tenderness and no fullness. No tenderness or bleeding around the vagina. No foreign body around the vagina. No vaginal discharge found.       Cervic is mildly erythematou.   Skin: Skin is warm and dry.  Psychiatric: She has a normal mood and affect. Her behavior is normal.          Assessment & Plan:  Pap smear-exam is fairly normal. We will call her with the results.  Contraception-refill her birth control. She's happy with current regimen and blood pressure looks great.  She is due for screening lipid since her LDL was slightly elevated last year. Given lab slip today.  ADD-she's not quite due for refill her Vyvanse I did give her a coupon card today.  She will get her flu shot at work.

## 2011-12-05 NOTE — Addendum Note (Signed)
Addended by: Judie Petit A on: 12/05/2011 10:48 AM   Modules accepted: Orders

## 2011-12-20 ENCOUNTER — Other Ambulatory Visit: Payer: Self-pay | Admitting: *Deleted

## 2011-12-20 MED ORDER — LISDEXAMFETAMINE DIMESYLATE 50 MG PO CAPS: 50.0000 mg | ORAL_CAPSULE | ORAL | Status: DC

## 2012-01-20 ENCOUNTER — Other Ambulatory Visit: Payer: Self-pay | Admitting: *Deleted

## 2012-01-20 MED ORDER — LISDEXAMFETAMINE DIMESYLATE 50 MG PO CAPS: 50.0000 mg | ORAL_CAPSULE | ORAL | Status: DC

## 2012-02-17 ENCOUNTER — Other Ambulatory Visit: Payer: Self-pay | Admitting: *Deleted

## 2012-02-17 MED ORDER — LISDEXAMFETAMINE DIMESYLATE 50 MG PO CAPS: 50.0000 mg | ORAL_CAPSULE | ORAL | Status: DC

## 2012-03-19 ENCOUNTER — Other Ambulatory Visit: Payer: Self-pay | Admitting: *Deleted

## 2012-03-19 MED ORDER — LISDEXAMFETAMINE DIMESYLATE 50 MG PO CAPS: 50.0000 mg | ORAL_CAPSULE | ORAL | Status: DC

## 2012-04-17 ENCOUNTER — Other Ambulatory Visit: Payer: Self-pay | Admitting: *Deleted

## 2012-04-17 MED ORDER — LISDEXAMFETAMINE DIMESYLATE 50 MG PO CAPS: 50.0000 mg | ORAL_CAPSULE | ORAL | Status: DC

## 2012-05-15 ENCOUNTER — Telehealth: Payer: Self-pay | Admitting: *Deleted

## 2012-05-15 MED ORDER — LISDEXAMFETAMINE DIMESYLATE 50 MG PO CAPS: 50.0000 mg | ORAL_CAPSULE | ORAL | Status: DC

## 2012-05-15 NOTE — Telephone Encounter (Signed)
Pt request a refill on Vyvanse

## 2012-05-15 NOTE — Telephone Encounter (Signed)
RF printed  

## 2012-06-17 ENCOUNTER — Ambulatory Visit (INDEPENDENT_AMBULATORY_CARE_PROVIDER_SITE_OTHER): Payer: BC Managed Care – PPO | Admitting: Family Medicine

## 2012-06-17 ENCOUNTER — Encounter: Payer: Self-pay | Admitting: Family Medicine

## 2012-06-17 VITALS — BP 97/63 | HR 78 | Ht 66.0 in | Wt 143.0 lb

## 2012-06-17 DIAGNOSIS — F988 Other specified behavioral and emotional disorders with onset usually occurring in childhood and adolescence: Secondary | ICD-10-CM

## 2012-06-17 DIAGNOSIS — Z0000 Encounter for general adult medical examination without abnormal findings: Secondary | ICD-10-CM

## 2012-06-17 MED ORDER — LISDEXAMFETAMINE DIMESYLATE 50 MG PO CAPS: 50.0000 mg | ORAL_CAPSULE | ORAL | Status: DC

## 2012-06-17 NOTE — Progress Notes (Signed)
  Subjective:    Patient ID: Monica Santiago, female    DOB: 1976-02-27, 37 y.o.   MRN: [id number]  HPI ADD - Toleating med well. Feels it is controlling her sxs.  No insomnia.  No shakiness, CP or SOB on the meidcation. Likes her current dose.  Occ feels tired. No family history of thyroid problems.  Of note, she never went for her blood work when she had a physical back in October. Her husband lost his job and she lost her health insurance before she was able to go. Fortunately her husband has a new job and his insurance just started.  Review of Systems     Objective:   Physical Exam  Constitutional: She is oriented to person, place, and time. She appears well-developed and well-nourished.  HENT:  Head: Normocephalic and atraumatic.  Cardiovascular: Normal rate, regular rhythm and normal heart sounds.   Pulmonary/Chest: Effort normal and breath sounds normal.  Neurological: She is alert and oriented to person, place, and time.  Skin: Skin is warm and dry.  Psychiatric: She has a normal mood and affect. Her behavior is normal.          Assessment & Plan:  ADD - tolerating medication well. Pressures well controlled. Side effect in her sleep. But increase anxiety or shakiness. Happy with current regimen. Refills given to patient. Followup in 6 months.  Labs preprinted for her physical. She can go anytime she is fasting.

## 2012-07-16 ENCOUNTER — Other Ambulatory Visit: Payer: Self-pay | Admitting: *Deleted

## 2012-07-16 MED ORDER — LISDEXAMFETAMINE DIMESYLATE 50 MG PO CAPS: 50.0000 mg | ORAL_CAPSULE | ORAL | Status: DC

## 2012-08-17 ENCOUNTER — Other Ambulatory Visit: Payer: Self-pay | Admitting: *Deleted

## 2012-08-17 MED ORDER — LISDEXAMFETAMINE DIMESYLATE 50 MG PO CAPS: 50.0000 mg | ORAL_CAPSULE | ORAL | Status: DC

## 2012-09-15 ENCOUNTER — Other Ambulatory Visit: Payer: Self-pay | Admitting: *Deleted

## 2012-09-15 MED ORDER — LISDEXAMFETAMINE DIMESYLATE 50 MG PO CAPS: 50.0000 mg | ORAL_CAPSULE | ORAL | Status: DC

## 2012-09-15 NOTE — Progress Notes (Signed)
vyvanse refilled

## 2012-10-13 ENCOUNTER — Other Ambulatory Visit: Payer: Self-pay | Admitting: *Deleted

## 2012-10-13 MED ORDER — LISDEXAMFETAMINE DIMESYLATE 50 MG PO CAPS: 50.0000 mg | ORAL_CAPSULE | ORAL | Status: DC

## 2012-10-13 NOTE — Progress Notes (Signed)
vyvanse refilled

## 2012-11-17 ENCOUNTER — Other Ambulatory Visit: Payer: Self-pay | Admitting: *Deleted

## 2012-11-17 MED ORDER — LISDEXAMFETAMINE DIMESYLATE 50 MG PO CAPS: 50.0000 mg | ORAL_CAPSULE | ORAL | Status: DC

## 2012-12-03 ENCOUNTER — Encounter: Payer: Self-pay | Admitting: Family Medicine

## 2012-12-03 ENCOUNTER — Ambulatory Visit (INDEPENDENT_AMBULATORY_CARE_PROVIDER_SITE_OTHER): Payer: BC Managed Care – PPO | Admitting: Family Medicine

## 2012-12-03 VITALS — BP 110/77 | HR 90 | Ht 66.0 in | Wt 143.0 lb

## 2012-12-03 DIAGNOSIS — Z0000 Encounter for general adult medical examination without abnormal findings: Secondary | ICD-10-CM

## 2012-12-03 LAB — COMPLETE METABOLIC PANEL WITH GFR
ALT: 10 U/L (ref 0–35)
AST: 14 U/L (ref 0–37)
Albumin: 3.9 g/dL (ref 3.5–5.2)
Alkaline Phosphatase: 28 U/L — ABNORMAL LOW (ref 39–117)
Glucose, Bld: 70 mg/dL (ref 70–99)
Potassium: 3.9 mEq/L (ref 3.5–5.3)
Sodium: 140 mEq/L (ref 135–145)
Total Bilirubin: 0.7 mg/dL (ref 0.3–1.2)
Total Protein: 6.7 g/dL (ref 6.0–8.3)

## 2012-12-03 LAB — LIPID PANEL
HDL: 59 mg/dL (ref >39)
LDL Cholesterol: 78 mg/dL (ref 0–99)
Total CHOL/HDL Ratio: 2.6 Ratio
VLDL: 17 mg/dL (ref 0–40)

## 2012-12-03 MED ORDER — NORGESTIM-ETH ESTRAD TRIPHASIC 0.18/0.215/0.25 MG-35 MCG PO TABS: 1.0000 | ORAL_TABLET | Freq: Every day | ORAL | Status: DC

## 2012-12-03 MED ORDER — LISDEXAMFETAMINE DIMESYLATE 50 MG PO CAPS: 50.0000 mg | ORAL_CAPSULE | ORAL | Status: DC

## 2012-12-03 NOTE — Progress Notes (Signed)
  Subjective:     Monica Santiago is a 37 y.o. female and is here for a comprehensive physical exam. The patient reports no problems.  History   Social History  . Marital Status: Married    Spouse Name: [name]    Number of Children: 1  . 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    Social History Main Topics  . Smoking status: Never Smoker   . Smokeless tobacco: Never Used  . Alcohol Use: No  . Drug Use: Not on file  . Sexual Activity: Yes    Partners: Female   Other Topics Concern  . Not on file   Social History Narrative   Some regular exercise.    Health Maintenance  Topic Date Due  . Influenza Vaccine  09/02/2013  . Pap Smear  12/05/2014  . Tetanus/tdap  01/03/2019    The following portions of the patient's history were reviewed and updated as appropriate: allergies, current medications, past family history, past medical history, past social history, past surgical history and problem list.  Review of Systems A comprehensive review of systems was negative.   Objective:    BP 110/77  Pulse 90  Ht 5\' 6"  (1.676 m)  Wt 143 lb (64.864 kg)  BMI 23.09 kg/m2  LMP 11/27/2012 General appearance: alert, cooperative and appears stated age Head: Normocephalic, without obvious abnormality, atraumatic Eyes: conj clear, EOMi, PEERLA Ears: normal TM's and external ear canals both ears Nose: Nares normal. Septum midline. Mucosa normal. No drainage or sinus tenderness. Throat: lips, mucosa, and tongue normal; teeth and gums normal Neck: no adenopathy, no carotid bruit, no JVD, supple, symmetrical, trachea midline and thyroid not enlarged, symmetric, no tenderness/mass/nodules Back: symmetric, no curvature. ROM normal. No CVA tenderness. Lungs: clear to auscultation bilaterally Breasts: normal appearance, no masses or tenderness Heart: regular rate and rhythm, S1, S2 normal, no murmur, click, rub or gallop Abdomen: soft,  non-tender; bowel sounds normal; no masses,  no organomegaly Extremities: extremities normal, atraumatic, no cyanosis or edema Pulses: 2+ and symmetric Skin: Skin color, texture, turgor normal. No rashes or lesions Lymph nodes: Cervical, supraclavicular, and axillary nodes normal. Neurologic: Alert and oriented X 3, normal strength and tone. Normal symmetric reflexes. Normal coordination and gait    Assessment:    Healthy female exam.      Plan:     See After Visit Summary for Counseling Recommendations  Keep up a regular exercise program and make sure you are eating a healthy diet Try to eat 4 servings of dairy a day, or if you are lactose intolerant take a calcium with vitamin D daily.  Your vaccines are up to date.  Declined flu vaccine. She's actually going to get at work tomorrow. Tetanus is up-to-date. Due for CMP and fasting lipid panel she will go today.  ADD-doing well on current regimen. Her blood pressure issues on the medication her symptoms such as chest pain or short of breath. Refill for 90 days and coupon card given. Followup in 6 months for ADD.  Contraceptive counseling-birth control refill. She's happy with her current regimen.

## 2012-12-03 NOTE — Patient Instructions (Addendum)
Keep up a regular exercise program and make sure you are eating a healthy diet Try to eat 4 servings of dairy a day, or if you are lactose intolerant take a calcium with vitamin D daily.  Your vaccines are up to date.   

## 2012-12-04 NOTE — Progress Notes (Signed)
Quick Note:  All labs are normal. ______ 

## 2013-03-11 ENCOUNTER — Ambulatory Visit (INDEPENDENT_AMBULATORY_CARE_PROVIDER_SITE_OTHER): Payer: BC Managed Care – PPO | Admitting: Family Medicine

## 2013-03-11 ENCOUNTER — Encounter: Payer: Self-pay | Admitting: Family Medicine

## 2013-03-11 VITALS — BP 123/72 | HR 94 | Temp 97.9°F | Ht 66.0 in | Wt 144.0 lb

## 2013-03-11 DIAGNOSIS — F988 Other specified behavioral and emotional disorders with onset usually occurring in childhood and adolescence: Secondary | ICD-10-CM

## 2013-03-11 DIAGNOSIS — J019 Acute sinusitis, unspecified: Secondary | ICD-10-CM

## 2013-03-11 MED ORDER — AMOXICILLIN-POT CLAVULANATE 875-125 MG PO TABS: 1.0000 | ORAL_TABLET | Freq: Two times a day (BID) | ORAL | Status: DC

## 2013-03-11 MED ORDER — LISDEXAMFETAMINE DIMESYLATE 50 MG PO CAPS: 50.0000 mg | ORAL_CAPSULE | ORAL | Status: DC

## 2013-03-11 NOTE — Patient Instructions (Signed)

## 2013-03-11 NOTE — Progress Notes (Signed)
   Subjective:    Patient ID: Monica BolusJessica L Carothers, female    DOB: [birth year], 38 y.o.   MRN: [id number]  HPI Sinus sxs x 2 weeks. Using saline, warm moist towels. Bialt facial pain and pressure.  Using Mucinex. No St or ear pain. No fever, chills, no GI sxs.   ADD- tolerating medication well without any side effects. Not affecting her appetite or sleep. She feels like her current dose is effective. Would like 90 day refill today.  Review of Systems     Objective:   Physical Exam  Constitutional: She is oriented to person, place, and time. She appears well-developed and well-nourished.  HENT:  Head: Normocephalic and atraumatic.  Right Ear: External ear normal.  Left Ear: External ear normal.  Nose: Nose normal.  Mouth/Throat: Oropharynx is clear and moist.  TMs and canals are clear. Tender overa bilat maxillary sinuses  Eyes: Conjunctivae and EOM are normal. Pupils are equal, round, and reactive to light.  Neck: Neck supple. No thyromegaly present.  Cardiovascular: Normal rate, regular rhythm and normal heart sounds.   Pulmonary/Chest: Effort normal and breath sounds normal. She has no wheezes.  Lymphadenopathy:    She has no cervical adenopathy.  Neurological: She is alert and oriented to person, place, and time.  Skin: Skin is warm and dry.  Psychiatric: She has a normal mood and affect.          Assessment & Plan:  Acute sinusitis- will tx with Augmenting. Call if not better in one week. Continue symptomatic care.   ADD- also due for refill on Vyvanse. 90 day supply given. Followup in 6 months.

## 2013-05-06 ENCOUNTER — Encounter: Payer: Self-pay | Admitting: Family Medicine

## 2013-05-06 ENCOUNTER — Ambulatory Visit (INDEPENDENT_AMBULATORY_CARE_PROVIDER_SITE_OTHER): Payer: BC Managed Care – PPO | Admitting: Family Medicine

## 2013-05-06 ENCOUNTER — Other Ambulatory Visit: Payer: Self-pay | Admitting: *Deleted

## 2013-05-06 VITALS — BP 120/76 | HR 104 | Wt 143.0 lb

## 2013-05-06 DIAGNOSIS — F988 Other specified behavioral and emotional disorders with onset usually occurring in childhood and adolescence: Secondary | ICD-10-CM

## 2013-05-06 MED ORDER — LISDEXAMFETAMINE DIMESYLATE 50 MG PO CAPS: 50.0000 mg | ORAL_CAPSULE | ORAL | Status: DC

## 2013-05-06 NOTE — Progress Notes (Signed)
   Subjective:    Patient ID: Monica Santiago, female    DOB: Aug 29, 1975, 38 y.o.   MRN: [id number]  HPI  ADD- No Cp or SOB or palpitations. It is not affecting her sleep. She's not skipping any meals. No significant weight loss. She's happy with her current dose and feels it's very effective. She would like to have her refill for 90 days as her insurance will cover this.  Review of Systems     Objective:   Physical Exam  Constitutional: She is oriented to person, place, and time. She appears well-developed and well-nourished.  HENT:  Head: Normocephalic and atraumatic.  Cardiovascular: Normal rate, regular rhythm and normal heart sounds.   Pulmonary/Chest: Effort normal and breath sounds normal.  Neurological: She is alert and oriented to person, place, and time.  Skin: Skin is warm and dry.  Psychiatric: She has a normal mood and affect. Her behavior is normal.          Assessment & Plan:  ADD- doing well overall.  Will refill today. Followup in 6 months. Refill given for 90 days supply. OK to refill again in 3 months.

## 2013-08-23 ENCOUNTER — Other Ambulatory Visit: Payer: Self-pay | Admitting: *Deleted

## 2013-08-23 MED ORDER — LISDEXAMFETAMINE DIMESYLATE 50 MG PO CAPS: 50.0000 mg | ORAL_CAPSULE | ORAL | Status: DC

## 2013-11-15 ENCOUNTER — Encounter: Payer: Self-pay | Admitting: Family Medicine

## 2013-11-15 ENCOUNTER — Other Ambulatory Visit (HOSPITAL_COMMUNITY)
Admission: RE | Admit: 2013-11-15 | Discharge: 2013-11-15 | Disposition: A | Discharge status: Home / Self Care | Payer: BC Managed Care – PPO | Source: Ambulatory Visit | Attending: Family Medicine | Admitting: Family Medicine

## 2013-11-15 ENCOUNTER — Ambulatory Visit (INDEPENDENT_AMBULATORY_CARE_PROVIDER_SITE_OTHER): Payer: BC Managed Care – PPO | Admitting: Family Medicine

## 2013-11-15 VITALS — BP 113/70 | HR 109 | Ht 66.0 in | Wt 145.0 lb

## 2013-11-15 DIAGNOSIS — Z1151 Encounter for screening for human papillomavirus (HPV): Secondary | ICD-10-CM | POA: Insufficient documentation

## 2013-11-15 DIAGNOSIS — Z124 Encounter for screening for malignant neoplasm of cervix: Secondary | ICD-10-CM | POA: Diagnosis not present

## 2013-11-15 DIAGNOSIS — Z01419 Encounter for gynecological examination (general) (routine) without abnormal findings: Secondary | ICD-10-CM | POA: Insufficient documentation

## 2013-11-15 DIAGNOSIS — Z0000 Encounter for general adult medical examination without abnormal findings: Secondary | ICD-10-CM | POA: Diagnosis not present

## 2013-11-15 MED ORDER — NORGESTIM-ETH ESTRAD TRIPHASIC 0.18/0.215/0.25 MG-35 MCG PO TABS: 1.0000 | ORAL_TABLET | Freq: Every day | ORAL | Status: DC

## 2013-11-15 MED ORDER — LISDEXAMFETAMINE DIMESYLATE 50 MG PO CAPS: 50.0000 mg | ORAL_CAPSULE | ORAL | Status: DC

## 2013-11-15 NOTE — Progress Notes (Addendum)
  Subjective:     Monica Santiago is a 38 y.o. female and is here for a comprehensive physical exam. The patient reports no problems.  History   Social History  . Marital Status: Married    Spouse Name: [name]    Number of Children: 1  . 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    Social History Main Topics  . Smoking status: Never Smoker   . Smokeless tobacco: Never Used  . Alcohol Use: No  . Drug Use: Not on file  . Sexual Activity: Yes    Partners: Female   Other Topics Concern  . Not on file   Social History Narrative   Some regular exercise.    Health Maintenance  Topic Date Due  . Influenza Vaccine  11/16/2014 (Originally 10/02/2013)  . Pap Smear  12/05/2014  . Tetanus/tdap  01/03/2019    The following portions of the patient's history were reviewed and updated as appropriate: allergies, current medications, past family history, past medical history, past social history, past surgical history and problem list.  Review of Systems A comprehensive review of systems was negative.   Objective:    BP 113/70  Pulse 109  Ht  (1.676 m)  Wt 145 lb (65.772 kg)  BMI 23.41 kg/m2 General appearance: alert, cooperative and appears stated age Head: Normocephalic, without obvious abnormality, atraumatic Eyes: conjc lear, EOMI, PEERLA Ears: normal TM's and external ear canals both ears Nose: Nares normal. Septum midline. Mucosa normal. No drainage or sinus tenderness. Throat: lips, mucosa, and tongue normal; teeth and gums normal Neck: no adenopathy, no carotid bruit, no JVD, supple, symmetrical, trachea midline and thyroid not enlarged, symmetric, no tenderness/mass/nodules Back: symmetric, no curvature. ROM normal. No CVA tenderness. Lungs: clear to auscultation bilaterally Breasts: normal appearance, no masses or tenderness Heart: regular rate and rhythm, S1, S2 normal, no murmur, click, rub or gallop Abdomen:  soft, non-tender; bowel sounds normal; no masses,  no organomegaly Pelvic: cervix normal in appearance, external genitalia normal, no adnexal masses or tenderness, no cervical motion tenderness, rectovaginal septum normal, uterus normal size, shape, and consistency and vagina normal without discharge Extremities: extremities normal, atraumatic, no cyanosis or edema Pulses: 2+ and symmetric Skin: Skin color, texture, turgor normal. No rashes or lesions Lymph nodes: Cervical, supraclavicular, and axillary nodes normal. Neurologic: Alert and oriented X 3, normal strength and tone. Normal symmetric reflexes. Normal coordination and gait    Assessment:    Healthy female exam.      Plan:     See After Visit Summary for Counseling Recommendations  Keep up a regular exercise program and make sure you are eating a healthy diet Try to eat 4 servings of dairy a day, or if you are lactose intolerant take a calcium with vitamin D daily.  Your vaccines are up to date.  Will clal with pap smear results.  Declines to have bloodwork this year since her insurance won't cover it.    ADD- doing well on regimen. F/U in 6 months.

## 2013-11-15 NOTE — Patient Instructions (Signed)
Keep up a regular exercise program and make sure you are eating a healthy diet Try to eat 4 servings of dairy a day, or if you are lactose intolerant take a calcium with vitamin D daily.  Your vaccines are up to date.   

## 2013-11-15 NOTE — Addendum Note (Signed)
Addended by: Nani Gasser D on: 11/15/2013 09:31 AM   Modules accepted: Orders

## 2013-11-16 LAB — CYTOLOGY - PAP

## 2013-11-17 NOTE — Progress Notes (Signed)
Quick Note:  Call patient: Your Pap smear is normal. Repeat in 2-3 years. ______ 

## 2014-02-15 ENCOUNTER — Other Ambulatory Visit: Payer: Self-pay | Admitting: *Deleted

## 2014-02-15 MED ORDER — LISDEXAMFETAMINE DIMESYLATE 50 MG PO CAPS: 50.0000 mg | ORAL_CAPSULE | ORAL | Status: DC

## 2014-02-15 NOTE — Telephone Encounter (Signed)
Rx placed up front.[name], Flor Whitacre [name] lvm informing pt of this.[name], Valena Ivanov [place] CrossingLynetta

## 2014-05-09 ENCOUNTER — Encounter: Payer: Self-pay | Admitting: Family Medicine

## 2014-05-09 ENCOUNTER — Ambulatory Visit (INDEPENDENT_AMBULATORY_CARE_PROVIDER_SITE_OTHER): Payer: BLUE CROSS/BLUE SHIELD | Admitting: Family Medicine

## 2014-05-09 VITALS — BP 127/83 | HR 95 | Wt 144.0 lb

## 2014-05-09 DIAGNOSIS — F988 Other specified behavioral and emotional disorders with onset usually occurring in childhood and adolescence: Secondary | ICD-10-CM

## 2014-05-09 DIAGNOSIS — F909 Attention-deficit hyperactivity disorder, unspecified type: Secondary | ICD-10-CM | POA: Diagnosis not present

## 2014-05-09 MED ORDER — LISDEXAMFETAMINE DIMESYLATE 50 MG PO CAPS: 50.0000 mg | ORAL_CAPSULE | ORAL | Status: DC

## 2014-05-09 NOTE — Progress Notes (Signed)
   Subjective:    Patient ID: Monica Santiago, female    DOB: 07-09-1975, 39 y.o.   MRN: [id number]  HPI ADD- Doing  Well on medication. Takes at 6:30 in AM.  No S.E.  Staying on task at work. No CP or SOb or insomnia.  Says notices difference when doesn't take it. She occasionally skips a week and dose but this is not often.  Review of Systems     Objective:   Physical Exam  Constitutional: She is oriented to person, place, and time. She appears well-developed and well-nourished.  HENT:  Head: Normocephalic and atraumatic.  Cardiovascular: Normal rate, regular rhythm and normal heart sounds.   Pulmonary/Chest: Effort normal and breath sounds normal.  Neurological: She is alert and oriented to person, place, and time.  Skin: Skin is warm and dry.  Psychiatric: She has a normal mood and affect. Her behavior is normal.          Assessment & Plan:  ADD - BP is well controlled. Sxs are well controlled. Happy with current regimen. Perception refill for 90 day supply. Follow-up in 6 months.

## 2014-08-12 ENCOUNTER — Other Ambulatory Visit: Payer: Self-pay

## 2014-08-12 MED ORDER — LISDEXAMFETAMINE DIMESYLATE 50 MG PO CAPS
50.0000 mg | ORAL_CAPSULE | ORAL | Status: DC
Start: 2014-08-12 — End: 2014-11-04

## 2014-11-04 ENCOUNTER — Ambulatory Visit (INDEPENDENT_AMBULATORY_CARE_PROVIDER_SITE_OTHER): Payer: BLUE CROSS/BLUE SHIELD | Admitting: Family Medicine

## 2014-11-04 ENCOUNTER — Encounter: Payer: Self-pay | Admitting: Family Medicine

## 2014-11-04 VITALS — BP 106/68 | HR 76 | Ht 66.0 in | Wt 150.0 lb

## 2014-11-04 DIAGNOSIS — R635 Abnormal weight gain: Secondary | ICD-10-CM

## 2014-11-04 DIAGNOSIS — F909 Attention-deficit hyperactivity disorder, unspecified type: Secondary | ICD-10-CM | POA: Diagnosis not present

## 2014-11-04 DIAGNOSIS — R5383 Other fatigue: Secondary | ICD-10-CM

## 2014-11-04 DIAGNOSIS — K5901 Slow transit constipation: Secondary | ICD-10-CM

## 2014-11-04 DIAGNOSIS — F988 Other specified behavioral and emotional disorders with onset usually occurring in childhood and adolescence: Secondary | ICD-10-CM

## 2014-11-04 LAB — COMPLETE METABOLIC PANEL WITH GFR
ALT: 15 U/L (ref 6–29)
AST: 16 U/L (ref 10–30)
Albumin: 3.9 g/dL (ref 3.6–5.1)
Alkaline Phosphatase: 33 U/L (ref 33–115)
BILIRUBIN TOTAL: 0.6 mg/dL (ref 0.2–1.2)
BUN: 11 mg/dL (ref 7–25)
CO2: 27 mmol/L (ref 20–31)
Calcium: 9.2 mg/dL (ref 8.6–10.2)
Chloride: 104 mmol/L (ref 98–110)
Creat: 0.62 mg/dL (ref 0.50–1.10)
GFR, Est Non African American: >89 mL/min (ref >=60)
GLUCOSE: 73 mg/dL (ref 65–99)
Potassium: 3.7 mmol/L (ref 3.5–5.3)
Sodium: 139 mmol/L (ref 135–146)
TOTAL PROTEIN: 6.5 g/dL (ref 6.1–8.1)

## 2014-11-04 LAB — CBC
HCT: 41.5 % (ref 36.0–46.0)
HEMOGLOBIN: 14.1 g/dL (ref 12.0–15.0)
MCH: 31.1 pg (ref 26.0–34.0)
MCHC: 34 g/dL (ref 30.0–36.0)
MCV: 91.4 fL (ref 78.0–100.0)
MPV: 10.4 fL (ref 8.6–12.4)
PLATELETS: 216 10*3/uL (ref 150–400)
RBC: 4.54 MIL/uL (ref 3.87–5.11)
RDW: 13 % (ref 11.5–15.5)
WBC: 6.2 10*3/uL (ref 4.0–10.5)

## 2014-11-04 LAB — TSH: TSH: 1.122 u[IU]/mL (ref 0.350–4.500)

## 2014-11-04 MED ORDER — LINACLOTIDE 145 MCG PO CAPS: 145.0000 ug | ORAL_CAPSULE | Freq: Every day | ORAL | Status: DC

## 2014-11-04 MED ORDER — NORGESTIM-ETH ESTRAD TRIPHASIC 0.18/0.215/0.25 MG-35 MCG PO TABS: 1.0000 | ORAL_TABLET | Freq: Every day | ORAL | Status: DC

## 2014-11-04 MED ORDER — LISDEXAMFETAMINE DIMESYLATE 50 MG PO CAPS: 50.0000 mg | ORAL_CAPSULE | ORAL | Status: DC

## 2014-11-04 NOTE — Progress Notes (Signed)
   Subjective:    Patient ID: Monica Santiago, female    DOB: 04/21/1975, 39 y.o.   MRN: [id number]  HPI ADD - doing well on her Vyvanse. No chest pain palpitations no insomnia. She's tolerating it well and feels like it's effective.  Constipation x 4 weeks. Tried miralax for 6 weeks with minimal response.  Says going 5-6 days between BMs.  Has tried metamucil.  Has tries stimulaant laxative.  She has been trying to drink more water.  Has never had issues with this before. Has tried to eat more veggies.  No nausea or vomiting.  No blood in the stool.  She has been extremely fatigue as well. Sleeping well but says can sleep for 12 hours.  She has had some weight gain even though diet and activity haven't changes.    Review of Systems     Objective:   Physical Exam  Constitutional: She is oriented to person, place, and time. She appears well-developed and well-nourished.  HENT:  Head: Normocephalic and atraumatic.  Cardiovascular: Normal rate, regular rhythm and normal heart sounds.   Pulmonary/Chest: Effort normal and breath sounds normal.  Abdominal: Soft. Bowel sounds are normal. She exhibits no distension and no mass. There is no tenderness. There is no rebound and no guarding.  Musculoskeletal: She exhibits no edema.  Neurological: She is alert and oriented to person, place, and time.  Skin: Skin is warm and dry.  Psychiatric: She has a normal mood and affect. Her behavior is normal.          Assessment & Plan:  ADD-continue current regimen. Blood pressure at goal. Follow-up in 6 months. Her exam is normal. Next  Fatigue, weight gain, constipation-evaluate for hyperthyroidism. We'll also do a CBC to evaluate for anemia. She's tried multiple over-the-counter agents for the constipation without any significant effect. Consider starting her on Amitiza or Linzess. Linzess appears to be better covered on her insurance plan.

## 2014-12-04 ENCOUNTER — Other Ambulatory Visit: Payer: Self-pay | Admitting: Family Medicine

## 2015-02-09 ENCOUNTER — Other Ambulatory Visit: Payer: Self-pay | Admitting: *Deleted

## 2015-02-09 DIAGNOSIS — F988 Other specified behavioral and emotional disorders with onset usually occurring in childhood and adolescence: Secondary | ICD-10-CM

## 2015-02-09 MED ORDER — LISDEXAMFETAMINE DIMESYLATE 50 MG PO CAPS: 50.0000 mg | ORAL_CAPSULE | ORAL | Status: DC

## 2015-03-27 ENCOUNTER — Ambulatory Visit (INDEPENDENT_AMBULATORY_CARE_PROVIDER_SITE_OTHER): Payer: BLUE CROSS/BLUE SHIELD | Admitting: Family Medicine

## 2015-03-27 ENCOUNTER — Encounter: Payer: Self-pay | Admitting: Family Medicine

## 2015-03-27 ENCOUNTER — Ambulatory Visit (INDEPENDENT_AMBULATORY_CARE_PROVIDER_SITE_OTHER): Payer: BLUE CROSS/BLUE SHIELD

## 2015-03-27 VITALS — BP 127/78 | HR 120 | Wt 149.0 lb

## 2015-03-27 DIAGNOSIS — M5416 Radiculopathy, lumbar region: Secondary | ICD-10-CM

## 2015-03-27 DIAGNOSIS — Z9889 Other specified postprocedural states: Secondary | ICD-10-CM | POA: Insufficient documentation

## 2015-03-27 DIAGNOSIS — M5137 Other intervertebral disc degeneration, lumbosacral region: Secondary | ICD-10-CM | POA: Diagnosis not present

## 2015-03-27 MED ORDER — PREDNISONE 10 MG (48) PO TBPK: ORAL_TABLET | Freq: Every day | ORAL | Status: DC

## 2015-03-27 MED ORDER — GABAPENTIN 100 MG PO CAPS
100.0000 mg | ORAL_CAPSULE | Freq: Three times a day (TID) | ORAL | Status: DC
Start: 2015-03-27 — End: 2015-05-10

## 2015-03-27 NOTE — Progress Notes (Signed)
   Subjective:    I'm seeing this patient as a consultation for:  Dr. Linford Arnold  CC: Right low back pain  HPI: Patient notes acute onset of right low back pain radiating to the right lateral calf and thigh. She's had back pain off and on for several years and worsening over the last several months. She's had episodes of sciatica in the past that were treated with oral steroids. She previously was doing pretty well. 4 days ago she developed acute onset of right low back pain radiating down her leg to the right lateral thigh. She noticed that her leg gave way once but otherwise denies any weakness. She denies any numbness or bowel or bladder dysfunction. She is taking ibuprofen using heat cold and a TENS unit which do help.   Past medical history, Surgical history, Family history not pertinant except as noted below, Social history, Allergies, and medications have been entered into the medical record, reviewed, and no changes needed.   Review of Systems: No headache, visual changes, nausea, vomiting, diarrhea, constipation, dizziness, abdominal pain, skin rash, fevers, chills, night sweats, weight loss, swollen lymph nodes, body aches, joint swelling, muscle aches, chest pain, shortness of breath, mood changes, visual or auditory hallucinations.   Objective:    Filed Vitals:   03/27/15 0857  BP: 127/78  Pulse: 120   General: Well Developed, well nourished, and in no acute distress.  Neuro/Psych: Alert and oriented x3, extra-ocular muscles intact, able to move all 4 extremities, sensation grossly intact. Skin: Warm and dry, no rashes noted.  Respiratory: Not using accessory muscles, speaking in full sentences, trachea midline.  Cardiovascular: Pulses palpable, no extremity edema. Abdomen: Does not appear distended. MSK: Back is nontender to spinal midline. Mildly tender palpation right buttocks. Back range of motion is limited in flexion by pain. Reflexes of the bilateral knees and ankles are  hyperreflexive but equal bilaterally. Lower extremity strength is equal and normal throughout. Mild antalgic gait. Positive slump test right side.   X-ray lumbar spine pending  No results found for this or any previous visit (from the past 24 hour(s)). No results found.  Impression and Recommendations:   This case required medical decision making of moderate complexity.

## 2015-03-27 NOTE — Progress Notes (Signed)
Quick Note:  X-ray of the back shows mild arthritis ______

## 2015-03-27 NOTE — Patient Instructions (Signed)
Thank you for coming in today. Return in 3 weeks.  Go to PT.  Come back or go to the emergency room if you notice new weakness new numbness problems walking or bowel or bladder problems.  Sciatica Sciatica is pain, weakness, numbness, or tingling along the path of the sciatic nerve. The nerve starts in the lower back and runs down the back of each leg. The nerve controls the muscles in the lower leg and in the back of the knee, while also providing sensation to the back of the thigh, lower leg, and the sole of your foot. Sciatica is a symptom of another medical condition. For instance, nerve damage or certain conditions, such as a herniated disk or bone spur on the spine, pinch or put pressure on the sciatic nerve. This causes the pain, weakness, or other sensations normally associated with sciatica. Generally, sciatica only affects one side of the body. CAUSES   Herniated or slipped disc.  Degenerative disk disease.  A pain disorder involving the narrow muscle in the buttocks (piriformis syndrome).  Pelvic injury or fracture.  Pregnancy.  Tumor (rare). SYMPTOMS  Symptoms can vary from mild to very severe. The symptoms usually travel from the low back to the buttocks and down the back of the leg. Symptoms can include:  Mild tingling or dull aches in the lower back, leg, or hip.  Numbness in the back of the calf or sole of the foot.  Burning sensations in the lower back, leg, or hip.  Sharp pains in the lower back, leg, or hip.  Leg weakness.  Severe back pain inhibiting movement. These symptoms may get worse with coughing, sneezing, laughing, or prolonged sitting or standing. Also, being overweight may worsen symptoms. DIAGNOSIS  Your caregiver will perform a physical exam to look for common symptoms of sciatica. He or she may ask you to do certain movements or activities that would trigger sciatic nerve pain. Other tests may be performed to find the cause of the sciatica. These  may include:  Blood tests.  X-rays.  Imaging tests, such as an MRI or CT scan. TREATMENT  Treatment is directed at the cause of the sciatic pain. Sometimes, treatment is not necessary and the pain and discomfort goes away on its own. If treatment is needed, your caregiver may suggest:  Over-the-counter medicines to relieve pain.  Prescription medicines, such as anti-inflammatory medicine, muscle relaxants, or narcotics.  Applying heat or ice to the painful area.  Steroid injections to lessen pain, irritation, and inflammation around the nerve.  Reducing activity during periods of pain.  Exercising and stretching to strengthen your abdomen and improve flexibility of your spine. Your caregiver may suggest losing weight if the extra weight makes the back pain worse.  Physical therapy.  Surgery to eliminate what is pressing or pinching the nerve, such as a bone spur or part of a herniated disk. HOME CARE INSTRUCTIONS   Only take over-the-counter or prescription medicines for pain or discomfort as directed by your caregiver.  Apply ice to the affected area for 20 minutes, 3-4 times a day for the first 48-72 hours. Then try heat in the same way.  Exercise, stretch, or perform your usual activities if these do not aggravate your pain.  Attend physical therapy sessions as directed by your caregiver.  Keep all follow-up appointments as directed by your caregiver.  Do not wear high heels or shoes that do not provide proper support.  Check your mattress to see if it is  too soft. A firm mattress may lessen your pain and discomfort. SEEK IMMEDIATE MEDICAL CARE IF:   You lose control of your bowel or bladder (incontinence).  You have increasing weakness in the lower back, pelvis, buttocks, or legs.  You have redness or swelling of your back.  You have a burning sensation when you urinate.  You have pain that gets worse when you lie down or awakens you at night.  Your pain is  worse than you have experienced in the past.  Your pain is lasting longer than 4 weeks.  You are suddenly losing weight without reason. MAKE SURE YOU:  Understand these instructions.  Will watch your condition.  Will get help right away if you are not doing well or get worse.   This information is not intended to replace advice given to you by your health care provider. Make sure you discuss any questions you have with your health care provider.   Document Released: 02/12/2001 Document Revised: 11/09/2014 Document Reviewed: 06/30/2011 Elsevier Interactive Patient Education [employer]

## 2015-03-27 NOTE — Assessment & Plan Note (Signed)
Symptoms consistent with sciatica. Treat with oral prednisone dose pack, gabapentin, physical therapy referral. Obtain x-ray lumbar spine. Follow-up in 3 weeks. If worsening or not improving will proceed with MRI for epidural steroid planning.

## 2015-04-05 ENCOUNTER — Ambulatory Visit: Payer: BLUE CROSS/BLUE SHIELD | Admitting: Rehabilitative and Restorative Service Providers"

## 2015-04-13 ENCOUNTER — Encounter: Payer: Self-pay | Admitting: Rehabilitative and Restorative Service Providers"

## 2015-04-13 ENCOUNTER — Ambulatory Visit (INDEPENDENT_AMBULATORY_CARE_PROVIDER_SITE_OTHER): Payer: BLUE CROSS/BLUE SHIELD | Admitting: Rehabilitative and Restorative Service Providers"

## 2015-04-13 DIAGNOSIS — M7918 Myalgia, other site: Secondary | ICD-10-CM

## 2015-04-13 DIAGNOSIS — Z7409 Other reduced mobility: Secondary | ICD-10-CM | POA: Diagnosis not present

## 2015-04-13 DIAGNOSIS — M5416 Radiculopathy, lumbar region: Secondary | ICD-10-CM | POA: Diagnosis not present

## 2015-04-13 DIAGNOSIS — M623 Immobility syndrome (paraplegic): Secondary | ICD-10-CM

## 2015-04-13 DIAGNOSIS — M2560 Stiffness of unspecified joint, not elsewhere classified: Secondary | ICD-10-CM

## 2015-04-13 DIAGNOSIS — M797 Fibromyalgia: Secondary | ICD-10-CM | POA: Diagnosis not present

## 2015-04-13 NOTE — Therapy (Addendum)
[facility] [address] [place] [place], [place], [address] Phone: [phone]   Fax:  [phone]  Physical Therapy Evaluation  Patient Details  Name: Monica Santiago MRN: [id number] Date of Birth: 06/04/1975 Referring Provider: Dr. Lynne Leader   Encounter Date: 04/13/2015      PT End of Session - 04/13/15 1710    Visit Number 1   Number of Visits 12   Date for PT Re-Evaluation 05/25/15   PT Start Time 9163   PT Stop Time 1711   PT Time Calculation (min) 56 min   Activity Tolerance Patient tolerated treatment well      Past Medical History  Diagnosis Date  . ADD (attention deficit disorder)   . Recurrent UTI     Dr Laurence Ferrari    Past Surgical History  Procedure Laterality Date  . Appendectomy  2005  . Tonsillectomy  1984  . Ltcs  2004  . Endometrial polyp removed  2009    There were no vitals filed for this visit.  Visit Diagnosis:  Right lumbar radiculopathy - Plan: PT plan of care cert/re-cert  Stiffness due to immobility - Plan: PT plan of care cert/re-cert  Myofacial muscle pain - Plan: PT plan of care cert/re-cert  Impaired functional mobility and endurance - Plan: PT plan of care cert/re-cert      Subjective Assessment - 04/13/15 1624    Subjective Patient reports that she has had intermittent LBP for the past few years. She was treated a couple of years ago with steroids with resolution of symptoms. She reports increased pain with bending; cleaning; functioinal activities  ~ 1 month ago. She was treated with steroids and pain meds with good improvement. Now reports intermittent tightness and slight discomfort but no pain in the past two weeks.    How long can you sit comfortably? not at all when symptoms were present   How long can you stand comfortably? 10-15 min when symptoms were present    How long can you walk comfortably? not at all when symptoms were present    Diagnostic tests xrays (-)    Patient Stated Goals be able to resume activitiea snd kick boxing class and walk dog; would like to prevent recurrent symptoms    Currently in Pain? No/denies   Pain Location Back   Pain Orientation Lower;Right   Pain Descriptors / Indicators Throbbing   Pain Radiating Towards radiatieng to just below knee- posterior hip/thigh    Pain Onset 1 to 4 weeks ago   Pain Frequency --  was constant    Aggravating Factors  siitng; standing; wlaking; all activities   Pain Relieving Factors steroid dose pack            [facility] PT Assessment - 04/13/15 0001    Assessment   Medical Diagnosis lumbar radiculopathy    Referring Provider Dr. Lynne Leader    Onset Date/Surgical Date 03/14/15   Hand Dominance Right   Next MD Visit 04/17/15   Prior Therapy none   Precautions   Precautions None   Balance Screen   Has the patient fallen in the past 6 months No   Has the patient had a decrease in activity level because of a fear of falling?  No   Is the patient reluctant to leave their home because of a fear of falling?  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    Leisure household chores; kick boxing 1x/wk; treadmill 3x/wk 25-30 min; walking large dogs    Observation/Other Assessments   Focus on Therapeutic Outcomes (FOTO)  41% limitation    Sensation   Additional Comments WFL's per pt report    AROM   Overall AROM Comments discomfort with Lt lat trunk flex and rotation    Lumbar Flexion 75%   Lumbar Extension 45%   Lumbar - Right Side Bend 70%   Lumbar - Left Side Bend 75%   Lumbar - Right Rotation 40%   Lumbar - Left Rotation 35%   Strength   Overall Strength Comments 5/5 bilat LE's    Flexibility   Hamstrings tight Rt 74 deg; Lt 80 deg   Quadriceps tight Rt > Lt    ITB tight Lt > Rt    Piriformis tight Rt > Lt with pull and discomfort inot the Rt hip with Rt stretch    Palpation   Spinal mobility hypomobilty and discomfort CPA L3/4/5; UPA Lt L4/5; Rt L3/4/5    SI assessment  WFL   Palpation comment tender/tight lumbar paraspinals; QL; hip abductors Rt                    [facility] Adult PT Treatment/Exercise - 04/13/15 0001    Therapeutic Activites    Therapeutic Activities --  spine care    Lumbar Exercises: Stretches   Passive Hamstring Stretch 3 reps;30 seconds   Press Ups --  2-3 sec x 10 reps    Lumbar Exercises: Supine   Ab Set --  3 part core 10 sec x 10    Moist Heat Therapy   Number Minutes Moist Heat 15 Minutes   Moist Heat Location Lumbar Spine                PT Education - 04/13/15 1659    Education Details spine care; HEP    Person(s) Educated Patient   Methods Explanation;Demonstration;Tactile cues;Verbal cues;Handout   Comprehension Verbalized understanding;Returned demonstration;Verbal cues required;Tactile cues required             PT Long Term Goals - 04/13/15 1715    PT LONG TERM GOAL #1   Title Improve posture and alignment in sitting 05/25/15   Time 6   Period Weeks   Status New   PT LONG TERM GOAL #2   Title Improve muscular tightness to palpation through lumbar spine and Rt hip 05/24/15   Time 6   Period Weeks   Status New   PT LONG TERM GOAL #3   Title Return to regular exercise program inc kick boxing and treadmill 05/25/15   Time 6   Period Weeks   Status New   PT LONG TERM GOAL #4   Title I in HEP 05/25/15   Time 6   Period Weeks   Status New   PT LONG TERM GOAL #5   Title Imrpove FOTO to < /= 31% limitation 05/25/15   Time 6   Period Weeks   Status New               Plan - 04/13/15 1711    Clinical Impression Statement Patient presents with resolving lumbar radiculopathy. She has poor posture and alignment; poor ergonomic positiions; limited trunk and LE mobility;  myofacial pain and dysfunction; limited  functional activity level. Pt will benefit form PT to address problems identifited.    Pt will benefit from skilled therapeutic intervention in order to improve on the following deficits Postural dysfunction;Improper body mechanics;Increased fascial restricitons;Decreased range of motion;Decreased mobility;Decreased endurance;Decreased activity tolerance   Rehab Potential Good   PT Frequency 2x / week   PT Duration 6 weeks   PT Treatment/Interventions Patient/family education;ADLs/Self Care Home Management;Therapeutic exercise;Therapeutic activities;Manual techniques;Dry needling;Cryotherapy;Electrical Stimulation;Moist Heat;Traction;Ultrasound;Neuromuscular re-education   PT Next Visit Plan stretch iriformis/IT band/quads; core stabilization; TDN and/or manual work through [employer] and Rt hip; modalities; ed re body mechanics/positions for work and home    PT Vernal; posture/alignment   Consulted and Agree with Plan of Care Patient         Problem List Patient Active Problem List   Diagnosis Date Noted  . Lumbar radicular pain 03/27/2015  . Attention deficit disorder (ADD) 07/03/2010    Thomasa Heidler Nilda Simmer PT, MPH  04/13/2015, 5:19 PM  [facility] [address] [place], [place], [address] Phone: [phone]   Fax:  [phone]  Name: DORATHA MCSWAIN MRN: [id number] Date of Birth: 1975-11-21   PHYSICAL THERAPY DISCHARGE SUMMARY  Visits from Start of Care: evaluation only  Current functional level related to goals / functional outcomes: unchanged   Remaining deficits: unchanged   Education / Equipment: HEP  Plan: Patient agrees to discharge.  Patient goals were partially met. Patient is being discharged due to the patient's request.  ?????    Griff Badley P. Helene Kelp PT, MPH 05/17/2015 7:57 AM

## 2015-04-13 NOTE — Patient Instructions (Signed)
Trunk: Prone Extension (Press-Ups)    Lie on stomach on firm, flat surface. Relax bottom and legs. Raise chest in air with elbows straight. Keep hips flat on surface, sag stomach. Hold __2-3__ seconds. Repeat __10__ times. Do _3-4 ___ sessions per day. CAUTION: Movement should be gentle and slow.  Trunk Extension    Standing, place back of open hands on low back. Straighten spine then arch the back and move shoulders back. Hold 2-3 sec Repeat __2-3__ times per session. Do _several___ sessions per day   Abdominal Bracing With Pelvic Floor (Hook-Lying)    With neutral spine, tighten pelvic floor and abdominals sucking belly button to back bone. Hold 10 sec Repeat __10_ times. Do _several __ times a day. Progress to do this in sitting; standing; walking and with functional activities.   HIP: Hamstrings - Supine    Place strap around foot. Raise leg up, keep knee straight. Hold _30__ seconds. _3__ reps per set, _2-3__ sets per day  Sleeping on Back  Place pillow under knees. A pillow with cervical support and a roll around waist are also helpful. Copyright  VHI. All rights reserved.  Sleeping on Side Place pillow between knees. Use cervical support under neck and a roll around waist as needed. Copyright  VHI. All rights reserved.   Sleeping on Stomach   If this is the only desirable sleeping position, place pillow under lower legs, and under stomach or chest as needed.  Posture - Sitting   Sit upright, head facing forward. Try using a roll to support lower back. Keep shoulders relaxed, and avoid rounded back. Keep hips level with knees. Avoid crossing legs for long periods. Stand to Sit / Sit to Stand   To sit: Bend knees to lower self onto front edge of chair, then scoot back on seat. To stand: Reverse sequence by placing one foot forward, and scoot to front of seat. Use rocking motion to stand up.   Work Height and Reach  Ideal work height is no more than 2 to 4  inches below elbow level when standing, and at elbow level when sitting. Reaching should be limited to arm's length, with elbows slightly bent.  Bending  Bend at hips and knees, not back. Keep feet shoulder-width apart.    Posture - Standing   Good posture is important. Avoid slouching and forward head thrust. Maintain curve in low back and align ears over shoul- ders, hips over ankles.  Alternating Positions   Alternate tasks and change positions frequently to reduce fatigue and muscle tension. Take rest breaks. Computer Work   Position work to Art gallery manager. Use proper work and seat height. Keep shoulders back and down, wrists straight, and elbows at right angles. Use chair that provides full back support. Add footrest and lumbar roll as needed.  Getting Into / Out of Car  Lower self onto seat, scoot back, then bring in one leg at a time. Reverse sequence to get out.  Dressing  Lie on back to pull socks or slacks over feet, or sit and bend leg while keeping back straight.    Housework - Sink  Place one foot on ledge of cabinet under sink when standing at sink for prolonged periods.   Pushing / Pulling  Pushing is preferable to pulling. Keep back in proper alignment, and use leg muscles to do the work.  Deep Squat   Squat and lift with both arms held against upper trunk. Tighten stomach muscles without holding breath. Use smooth  movements to avoid jerking.  Avoid Twisting   Avoid twisting or bending back. Pivot around using foot movements, and bend at knees if needed when reaching for articles.  Carrying Luggage   Distribute weight evenly on both sides. Use a cart whenever possible. Do not twist trunk. Move body as a unit.   Lifting Principles .Maintain proper posture and head alignment. .Slide object as close as possible before lifting. .Move obstacles out of the way. .Test before lifting; ask for help if too heavy. .Tighten stomach muscles without holding  breath. .Use smooth movements; do not jerk. .Use legs to do the work, and pivot with feet. .Distribute the work load symmetrically and close to the center of trunk. .Push instead of pull whenever possible.   Ask For Help   Ask for help and delegate to others when possible. Coordinate your movements when lifting together, and maintain the low back curve.  Log Roll   Lying on back, bend left knee and place left arm across chest. Roll all in one movement to the right. Reverse to roll to the left. Always move as one unit. Housework - Sweeping  Use long-handled equipment to avoid stooping.   Housework - Wiping  Position yourself as close as possible to reach work surface. Avoid straining your back.  Laundry - Unloading Wash   To unload small items at bottom of washer, lift leg opposite to arm being used to reach.  Gardening - Raking  Move close to area to be raked. Use arm movements to do the work. Keep back straight and avoid twisting.     Cart  When reaching into cart with one arm, lift opposite leg to keep back straight.   Getting Into / Out of Bed  Lower self to lie down on one side by raising legs and lowering head at the same time. Use arms to assist moving without twisting. Bend both knees to roll onto back if desired. To sit up, start from lying on side, and use same move-ments in reverse. Housework - Vacuuming  Hold the vacuum with arm held at side. Step back and forth to move it, keeping head up. Avoid twisting.   Laundry - Armed forces training and education officer so that bending and twisting can be avoided.   Laundry - Unloading Dryer  Squat down to reach into clothes dryer or use a reacher.  Gardening - Weeding / Psychiatric nurse or Kneel. Knee pads may be helpful.

## 2015-04-17 ENCOUNTER — Ambulatory Visit: Payer: BLUE CROSS/BLUE SHIELD | Admitting: Family Medicine

## 2015-04-20 ENCOUNTER — Encounter: Payer: BLUE CROSS/BLUE SHIELD | Admitting: Physical Therapy

## 2015-05-10 ENCOUNTER — Ambulatory Visit (INDEPENDENT_AMBULATORY_CARE_PROVIDER_SITE_OTHER): Payer: BLUE CROSS/BLUE SHIELD | Admitting: Family Medicine

## 2015-05-10 ENCOUNTER — Encounter: Payer: Self-pay | Admitting: Family Medicine

## 2015-05-10 VITALS — BP 127/79 | HR 102 | Ht 66.0 in | Wt 153.0 lb

## 2015-05-10 DIAGNOSIS — M5416 Radiculopathy, lumbar region: Secondary | ICD-10-CM

## 2015-05-10 DIAGNOSIS — F988 Other specified behavioral and emotional disorders with onset usually occurring in childhood and adolescence: Secondary | ICD-10-CM

## 2015-05-10 DIAGNOSIS — F909 Attention-deficit hyperactivity disorder, unspecified type: Secondary | ICD-10-CM | POA: Diagnosis not present

## 2015-05-10 MED ORDER — AMPHETAMINE-DEXTROAMPHET ER 20 MG PO CP24: 20.0000 mg | ORAL_CAPSULE | Freq: Every day | ORAL | Status: DC

## 2015-05-10 NOTE — Progress Notes (Signed)
   Subjective:    Patient ID: Monica BolusJessica L Schommer, female    DOB: [birth year], 40 y.o.   MRN: [id number]  HPI F/U ADD  - No CP or SOB.  No palps. No Insomnia.  Taking medication and no S.E. Unfortunately her insurance will no longer cover Vyvanse. The last time she filled the cost her $300.  Saw dr. Denyse Amassorey for her back. Says it has been painful for months and radiates down her right leg.  She tried prednisone and multiple OTC tx.  Given Neurontin but made her feel extremely tired so stopped it.  Has been going to PT.  The prednisone did seem to help.  She plans on seeing ortho next week for further evaluation.  He did get some great tips from physical therapy and did learn a lot. She did get some relief. But her pain just seems to be getting worse again.   Review of Systems     Objective:   Physical Exam  Constitutional: She is oriented to person, place, and time. She appears well-developed and well-nourished.  HENT:  Head: Normocephalic and atraumatic.  Cardiovascular: Normal rate, regular rhythm and normal heart sounds.   Pulmonary/Chest: Effort normal and breath sounds normal.  Neurological: She is alert and oriented to person, place, and time.  Skin: Skin is warm and dry.  Psychiatric: She has a normal mood and affect. Her behavior is normal.          Assessment & Plan:  ADD- Discussed options since Vyvanse is no longer covered on her insurance. Adderall XR is the closest medication chemically. Printed new prescription for 90 day supply. We may need still need to adjust the dose a little bit as there is not an exact equivalent between the 2 drugs.  Lumbar pain with right radiculopathy-she plans to see or that in the next week or 2. We discussed options. An MRI would probably be the next step up and she has tried conservative therapy as well as physical therapy. Unfortunately she did not tolerate the gabapentin secondary to sedation. She also did not feel good on the prednisone either  though it did help her pain. We discussed continuing to work on the home exercises that she learned through physical therapy and continuing to try to walk and exercise as best as she can.

## 2015-08-14 ENCOUNTER — Telehealth: Payer: Self-pay | Admitting: *Deleted

## 2015-08-14 ENCOUNTER — Other Ambulatory Visit: Payer: Self-pay | Admitting: *Deleted

## 2015-08-14 MED ORDER — AMPHETAMINE-DEXTROAMPHET ER 20 MG PO CP24
20.0000 mg | ORAL_CAPSULE | Freq: Every day | ORAL | Status: DC
Start: 2015-08-14 — End: 2015-11-08

## 2015-08-14 NOTE — Telephone Encounter (Signed)
Med refill of adderall given.[name], Nijah Orlich DraperLynetta

## 2015-10-03 HISTORY — PX: LUMBAR MICRODISCECTOMY: SHX99

## 2015-10-16 DIAGNOSIS — M5126 Other intervertebral disc displacement, lumbar region: Secondary | ICD-10-CM | POA: Insufficient documentation

## 2015-10-18 DIAGNOSIS — M5127 Other intervertebral disc displacement, lumbosacral region: Secondary | ICD-10-CM | POA: Insufficient documentation

## 2015-11-02 ENCOUNTER — Encounter: Payer: Self-pay | Admitting: Family Medicine

## 2015-11-08 ENCOUNTER — Ambulatory Visit (INDEPENDENT_AMBULATORY_CARE_PROVIDER_SITE_OTHER): Payer: BLUE CROSS/BLUE SHIELD | Admitting: Family Medicine

## 2015-11-08 ENCOUNTER — Encounter: Payer: Self-pay | Admitting: Family Medicine

## 2015-11-08 VITALS — BP 121/75 | HR 97 | Ht 66.0 in | Wt 155.0 lb

## 2015-11-08 DIAGNOSIS — Z3041 Encounter for surveillance of contraceptive pills: Secondary | ICD-10-CM

## 2015-11-08 DIAGNOSIS — F909 Attention-deficit hyperactivity disorder, unspecified type: Secondary | ICD-10-CM | POA: Diagnosis not present

## 2015-11-08 DIAGNOSIS — F988 Other specified behavioral and emotional disorders with onset usually occurring in childhood and adolescence: Secondary | ICD-10-CM

## 2015-11-08 MED ORDER — NORGESTIM-ETH ESTRAD TRIPHASIC 0.18/0.215/0.25 MG-35 MCG PO TABS: 1.0000 | ORAL_TABLET | Freq: Every day | ORAL | 11 refills | Status: DC

## 2015-11-08 MED ORDER — AMPHETAMINE-DEXTROAMPHET ER 30 MG PO CP24
30.0000 mg | ORAL_CAPSULE | Freq: Every day | ORAL | 0 refills | Status: DC
Start: 2015-11-08 — End: 2016-02-08

## 2015-11-08 NOTE — Progress Notes (Signed)
Subjective:    CC: ADD  HPI:  Follow-up ADD-we'll switch her prescription for Vyvanse Adderall XR at her last office visit in March because of insurance reasons.She feels like it's not working as well and feels like it's almost giving her some OCD type tendencies. She says even her husband and her boss have noticed a difference. No chest pain palpitations or insomnia on the medication. She denies any significant decrease in appetite. Next  She soon to make sure when she is due for her next Pap smear. She is due for refill on her birth control. She is very happy with her pill.  Objective:    General: Well Developed, well nourished, and in no acute distress.  Neuro: Alert and oriented x3, extra-ocular muscles intact, sensation grossly intact.  HEENT: Normocephalic, atraumatic  Skin: Warm and dry, no rashes. Cardiac: Regular rate and rhythm, no murmurs rubs or gallops, no lower extremity edema.  Respiratory: Clear to auscultation bilaterally. Not using accessory muscles, speaking in full sentences.   Impression and Recommendations:    ADD- Increase current regimenTo Adderall XR 30 mg daily prescription printed for 90 day supply. Follow-up in 3 months.. Blood pressure at goal.  Birth control monitoring-she is very happy with her current regimen and feels like it really regulates her cycles. Refills sent for one year. Pap smears up-to-date and due to repeat in 2020.

## 2016-02-08 ENCOUNTER — Ambulatory Visit (INDEPENDENT_AMBULATORY_CARE_PROVIDER_SITE_OTHER): Payer: BLUE CROSS/BLUE SHIELD | Admitting: Family Medicine

## 2016-02-08 ENCOUNTER — Encounter: Payer: Self-pay | Admitting: Family Medicine

## 2016-02-08 VITALS — BP 117/73 | HR 96 | Ht 66.0 in | Wt 153.0 lb

## 2016-02-08 DIAGNOSIS — F988 Other specified behavioral and emotional disorders with onset usually occurring in childhood and adolescence: Secondary | ICD-10-CM | POA: Diagnosis not present

## 2016-02-08 MED ORDER — AMPHETAMINE-DEXTROAMPHET ER 30 MG PO CP24: 30.0000 mg | ORAL_CAPSULE | Freq: Every day | ORAL | 0 refills | Status: DC

## 2016-02-08 NOTE — Progress Notes (Signed)
Subjective:    CC: Three-month follow-up for ADD  HPI:  ADD-when I last saw her 3 months ago we decided to increase her Adderall XR to 30 mg. She is following up today to see how she's doing. She has been happy on her regimen.  She denies any chest pain palpitations shortness of breath or insomnia on the medication.  Past medical history, Surgical history, Family history not pertinant except as noted below, Social history, Allergies, and medications have been entered into the medical record, reviewed, and corrections made.   Review of Systems: No fevers, chills, night sweats, weight loss, chest pain, or shortness of breath.   Objective:    General: Well Developed, well nourished, and in no acute distress.  Neuro: Alert and oriented x3, extra-ocular muscles intact, sensation grossly intact.  HEENT: Normocephalic, atraumatic  Skin: Warm and dry, no rashes. Cardiac: Regular rate and rhythm, no murmurs rubs or gallops, no lower extremity edema.  Respiratory: Clear to auscultation bilaterally. Not using accessory muscles, speaking in full sentences.   Impression and Recommendations:    ADD-Controlled. She feels like the higher increased dose is working well for her and she is asymptomatic. Blood pressure and pulse look good. 90 day supply provided. At some point if her new insurance will cover the Vyvanse she actually prefer to go back to that one should. She felt like it worked the best. Follow-up in 3-4 months.

## 2016-05-07 ENCOUNTER — Ambulatory Visit (INDEPENDENT_AMBULATORY_CARE_PROVIDER_SITE_OTHER): Payer: Managed Care, Other (non HMO) | Admitting: Family Medicine

## 2016-05-07 ENCOUNTER — Encounter: Payer: Self-pay | Admitting: Family Medicine

## 2016-05-07 VITALS — BP 130/70 | HR 81 | Ht 66.0 in | Wt 155.0 lb

## 2016-05-07 DIAGNOSIS — F988 Other specified behavioral and emotional disorders with onset usually occurring in childhood and adolescence: Secondary | ICD-10-CM

## 2016-05-07 MED ORDER — LISDEXAMFETAMINE DIMESYLATE 50 MG PO CAPS: 50.0000 mg | ORAL_CAPSULE | ORAL | 0 refills | Status: DC

## 2016-05-07 NOTE — Progress Notes (Signed)
   Subjective:    Patient ID: Monica BolusJessica L Santiago, female    DOB: 07-09-1975, 41 y.o.   MRN: [id number]  HPI ADD - She comes in today to follow-up on her ADD. She is excited because she can now get Vyvanse covered under her plan. It does seem to be written is a 30 day supply and she has to fill it at a very specific pharmacy. She prefers this medication and is glad that she can actually switch back. Chest pain shortness of breath palpitations or insomnia on the medication.   She is not currently exercising but has been trying to walk a little more than usual. She did have her flu vaccine done at work back in the fall.  Review of Systems     Objective:   Physical Exam  Constitutional: She is oriented to person, place, and time. She appears well-developed and well-nourished.  HENT:  Head: Normocephalic and atraumatic.  Cardiovascular: Normal rate, regular rhythm and normal heart sounds.   Pulmonary/Chest: Effort normal and breath sounds normal.  Neurological: She is alert and oriented to person, place, and time.  Skin: Skin is warm and dry.  Psychiatric: She has a normal mood and affect. Her behavior is normal.          Assessment & Plan:  ADD - Doing well. We'll switch back to Vyvanse 50 mg daily. Prescriptions provided for the next 4 months. Follow-up in 4 months. Call if any palms concerns or symptoms. Stop immediately if any chest pain or discomfort.

## 2016-07-26 ENCOUNTER — Ambulatory Visit (INDEPENDENT_AMBULATORY_CARE_PROVIDER_SITE_OTHER): Payer: Managed Care, Other (non HMO) | Admitting: Family Medicine

## 2016-07-26 ENCOUNTER — Encounter: Payer: Self-pay | Admitting: Family Medicine

## 2016-07-26 VITALS — BP 125/81 | HR 97 | Temp 98.3°F | Wt 154.0 lb

## 2016-07-26 DIAGNOSIS — N3000 Acute cystitis without hematuria: Secondary | ICD-10-CM | POA: Diagnosis not present

## 2016-07-26 DIAGNOSIS — R300 Dysuria: Secondary | ICD-10-CM

## 2016-07-26 LAB — POCT URINALYSIS DIPSTICK
Bilirubin, UA: NEGATIVE
Glucose, UA: NEGATIVE
KETONES UA: NEGATIVE
Nitrite, UA: NEGATIVE
PH UA: 6 (ref 5.0–8.0)
PROTEIN UA: NEGATIVE
Spec Grav, UA: 1.01 (ref 1.010–1.025)
Urobilinogen, UA: 0.2 E.U./dL

## 2016-07-26 MED ORDER — CIPROFLOXACIN HCL 500 MG PO TABS: 500.0000 mg | ORAL_TABLET | Freq: Two times a day (BID) | ORAL | 0 refills | Status: AC

## 2016-07-26 NOTE — Patient Instructions (Addendum)

## 2016-07-26 NOTE — Progress Notes (Signed)
   Subjective:    Patient ID: Monica BolusJessica L Boehringer, female    DOB: June 03, 1975, 41 y.o.   MRN: [id number]  HPI 41 year old female comes in today with urinary tract symptoms On and off for about 3 weeks. She says she initially started getting some urgency and some pelvic pressure and discomfort. No hematuria or dysuria. She started thinking wants water and really pushing cranberry juice and says it actually got better for about a week. The symptoms have been gradually coming back. Now she is getting some right-sided low back pain. No fevers that she has had some chills on and off. She still has no dysuria but still has a lot of pelvic pressure and discomfort. She denies any change in her bowels. She is having to urinate multiple times at night. She also feels like she's not emptying all the way.   Review of Systems     Objective:   Physical Exam  Constitutional: She is oriented to person, place, and time. She appears well-developed and well-nourished.  HENT:  Head: Normocephalic and atraumatic.  Cardiovascular: Normal rate, regular rhythm and normal heart sounds.   Pulmonary/Chest: Effort normal and breath sounds normal.  Abdominal: Bowel sounds are normal. She exhibits no distension and no mass. There is tenderness. There is no rebound and no guarding.  Tenderness in the right lower quadrant and suprapubic area.  Musculoskeletal:  Mild tenderness over the right low back but not over the kidney area.  Neurological: She is alert and oriented to person, place, and time.  Skin: Skin is warm and dry.  Psychiatric: She has a normal mood and affect. Her behavior is normal.        Assessment & Plan:  Acute cystitis - Will treat with Cipro. Urine culture sent. Make sure continue to push fluids. Can use ibuprofen or Tylenol as needed for fever or pain relief. If she does develop a fever them please call us back. No sign of pyelonephritis on exam today.

## 2016-07-28 LAB — URINE CULTURE

## 2016-09-05 ENCOUNTER — Encounter: Payer: Self-pay | Admitting: Family Medicine

## 2016-09-05 ENCOUNTER — Ambulatory Visit (INDEPENDENT_AMBULATORY_CARE_PROVIDER_SITE_OTHER): Payer: Managed Care, Other (non HMO) | Admitting: Family Medicine

## 2016-09-05 VITALS — BP 124/77 | HR 92 | Wt 154.0 lb

## 2016-09-05 DIAGNOSIS — F988 Other specified behavioral and emotional disorders with onset usually occurring in childhood and adolescence: Secondary | ICD-10-CM

## 2016-09-05 MED ORDER — LISDEXAMFETAMINE DIMESYLATE 50 MG PO CAPS: 50.0000 mg | ORAL_CAPSULE | ORAL | 0 refills | Status: DC

## 2016-09-05 MED ORDER — LISDEXAMFETAMINE DIMESYLATE 50 MG PO CAPS
50.0000 mg | ORAL_CAPSULE | ORAL | 0 refills | Status: DC
Start: 2016-09-05 — End: 2016-09-05

## 2016-09-05 NOTE — Progress Notes (Signed)
   Subjective:    Patient ID: Monica Santiago, female    DOB: 07/17/1975, 41 y.o.   MRN: [id number]  HPI 41 year old female comes in today to follow-up for ADD. She's currently on Vyvanse 50 mg daily as well as birth control. She denies any recent changes. She is tolerating medication well without any side effects. No chest pain, short of breath, palpitations or insomnia.Check she feels like this dose is really working well and is a good balance. She's able to sleep without any difficulty. She does skip the weekends.  She denies any loss of appetite.   Review of Systems     Objective:   Physical Exam  Constitutional: She is oriented to person, place, and time. She appears well-developed and well-nourished.  HENT:  Head: Normocephalic and atraumatic.  Cardiovascular: Normal rate, regular rhythm and normal heart sounds.   Pulmonary/Chest: Effort normal and breath sounds normal.  Neurological: She is alert and oriented to person, place, and time.  Skin: Skin is warm and dry.  Psychiatric: She has a normal mood and affect. Her behavior is normal.       Assessment & Plan:  ADD - She is doing really well. Will refill Vyvanse for 3 more months. I'll follow her back up in 4 months if she still needs one prescription in the interim she can call the office for that. Blood pressure is at goal today and looks absolutely fantastic.

## 2016-12-06 ENCOUNTER — Encounter: Payer: Self-pay | Admitting: Family Medicine

## 2016-12-09 ENCOUNTER — Encounter: Payer: Self-pay | Admitting: Family Medicine

## 2016-12-09 ENCOUNTER — Ambulatory Visit (INDEPENDENT_AMBULATORY_CARE_PROVIDER_SITE_OTHER): Payer: Managed Care, Other (non HMO) | Admitting: Family Medicine

## 2016-12-09 VITALS — BP 114/87 | HR 81 | Ht 66.0 in | Wt 154.0 lb

## 2016-12-09 DIAGNOSIS — F988 Other specified behavioral and emotional disorders with onset usually occurring in childhood and adolescence: Secondary | ICD-10-CM | POA: Diagnosis not present

## 2016-12-09 DIAGNOSIS — Z0000 Encounter for general adult medical examination without abnormal findings: Secondary | ICD-10-CM | POA: Diagnosis not present

## 2016-12-09 MED ORDER — LISDEXAMFETAMINE DIMESYLATE 50 MG PO CAPS: 50.0000 mg | ORAL_CAPSULE | ORAL | 0 refills | Status: DC

## 2016-12-09 MED ORDER — NORGESTIM-ETH ESTRAD TRIPHASIC 0.18/0.215/0.25 MG-35 MCG PO TABS
1.0000 | ORAL_TABLET | Freq: Every day | ORAL | 11 refills | Status: DC
Start: 2016-12-09 — End: 2017-10-23

## 2016-12-09 NOTE — Patient Instructions (Addendum)

## 2016-12-09 NOTE — Progress Notes (Signed)
Subjective:     Monica Santiago is a 41 y.o. female and is here for a comprehensive physical exam. The patient reports no problems.  Social History   Social History  . Marital status: Married    Spouse name: [name]  . Number of children: 1  . Years of education: N/A   Occupational History  . 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    Social History Main Topics  . Smoking status: Never Smoker  . Smokeless tobacco: Never Used  . Alcohol use No  . Drug use: Unknown  . Sexual activity: Yes    Partners: Female   Other Topics Concern  . Not on file   Social History Narrative   Some regular exercise.    Health Maintenance  Topic Date Due  . HIV Screening  07/14/1989  . INFLUENZA VACCINE  12/09/2017 (Originally 10/02/2016)  . PAP SMEAR  11/16/2018  . TETANUS/TDAP  01/03/2019    The following portions of the patient's history were reviewed and updated as appropriate: allergies, current medications, past family history, past medical history, past social history, past surgical history and problem list.  Review of Systems A comprehensive review of systems was negative.   Objective:    BP 114/87   Pulse 81   Ht  (1.676 m)   Wt 154 lb (69.9 kg)   SpO2 100%   BMI 24.86 kg/m  General appearance: alert, cooperative and appears stated age Head: Normocephalic, without obvious abnormality, atraumatic Eyes: conj clear, EOMI, PEERLA Ears: normal TM's and external ear canals both ears Nose: Nares normal. Septum midline. Mucosa normal. No drainage or sinus tenderness. Throat: lips, mucosa, and tongue normal; teeth and gums normal Neck: no adenopathy, no carotid bruit, no JVD, supple, symmetrical, trachea midline and thyroid not enlarged, symmetric, no tenderness/mass/nodules Back: symmetric, no curvature. ROM normal. No CVA tenderness. Lungs: clear to auscultation bilaterally Breasts: normal appearance, no masses or tenderness Heart: regular rate  and rhythm, S1, S2 normal, no murmur, click, rub or gallop Abdomen: soft, non-tender; bowel sounds normal; no masses,  no organomegaly Extremities: extremities normal, atraumatic, no cyanosis or edema Pulses: 2+ and symmetric Skin: Skin color, texture, turgor normal. No rashes or lesions Lymph nodes: Cervical, supraclavicular, and axillary nodes normal. Neurologic: Alert and oriented X 3, normal strength and tone. Normal symmetric reflexes. Normal coordination and gait    Assessment:    Healthy female exam.      Plan:     See After Visit Summary for Counseling Recommendations   Keep up a regular exercise program and make sure you are eating a healthy diet Try to eat 4 servings of dairy a day, or if you are lactose intolerant take a calcium with vitamin D daily.  Your vaccines are up to date.   ADD-doing very well on her Vyvanse. Blood pressure well controlled. Refills provided for the next 90 days. Follow-up in 4 months she can call for remaining prescription.

## 2016-12-17 IMAGING — CR DG LUMBAR SPINE COMPLETE 4+V
5 series · 5 of 5 positions shown · non-contrast
Comparison: 07/16/2010

CLINICAL DATA: Right low back pain radiating into right hip and leg
for 6 months. No known injury.

EXAM:
LUMBAR SPINE - COMPLETE 4+ VIEW

[l-spine ap]
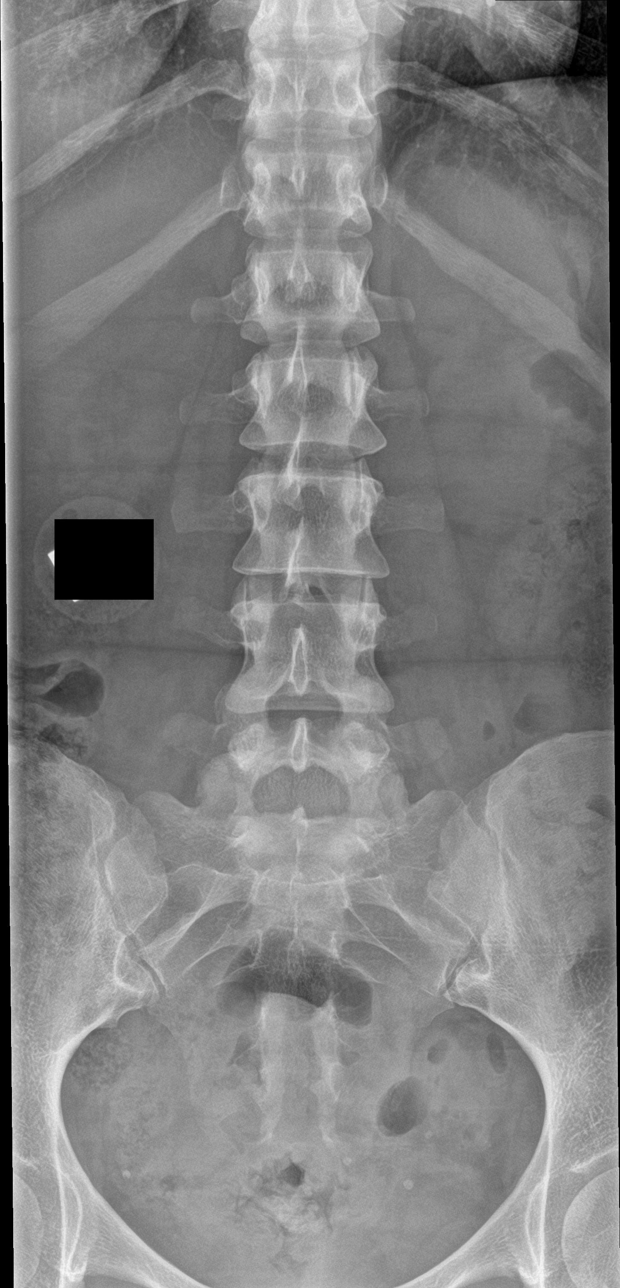

[l-spine obl (1 of 2)]
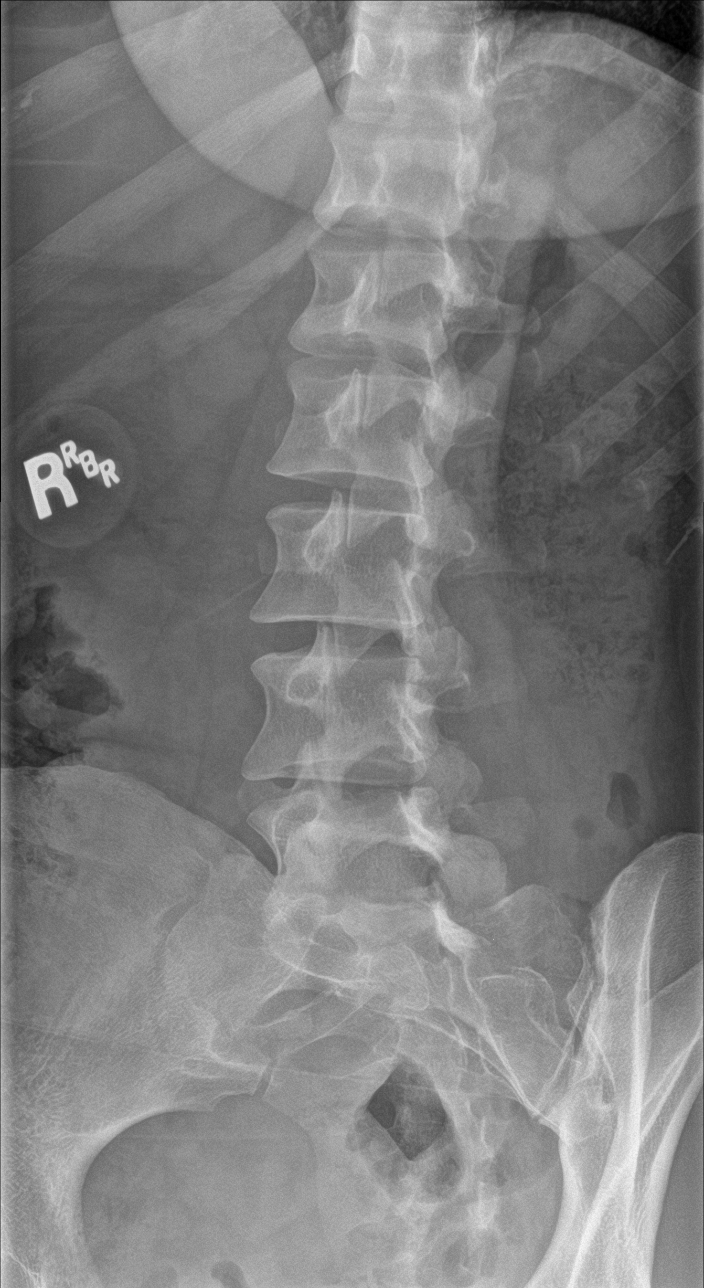

[l-spine obl (2 of 2)]
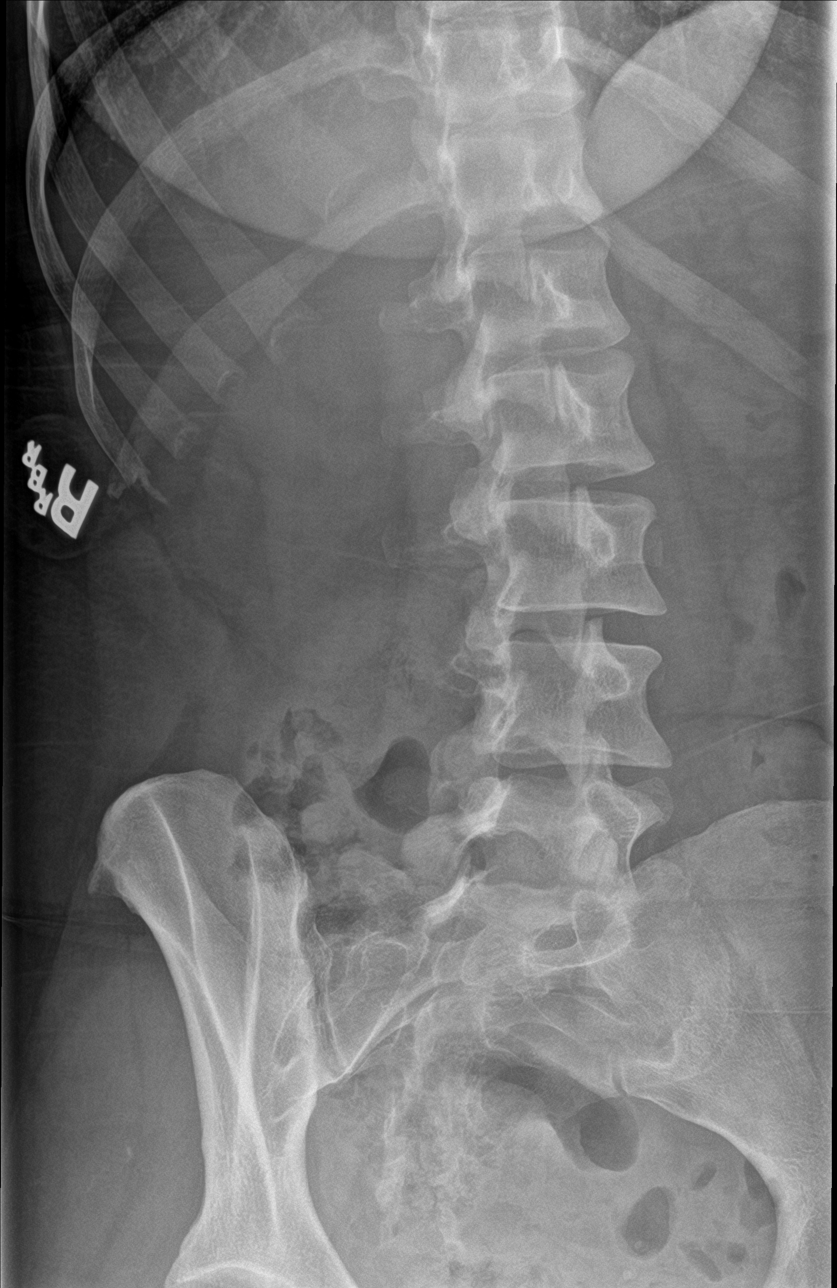

[l-spine lat]
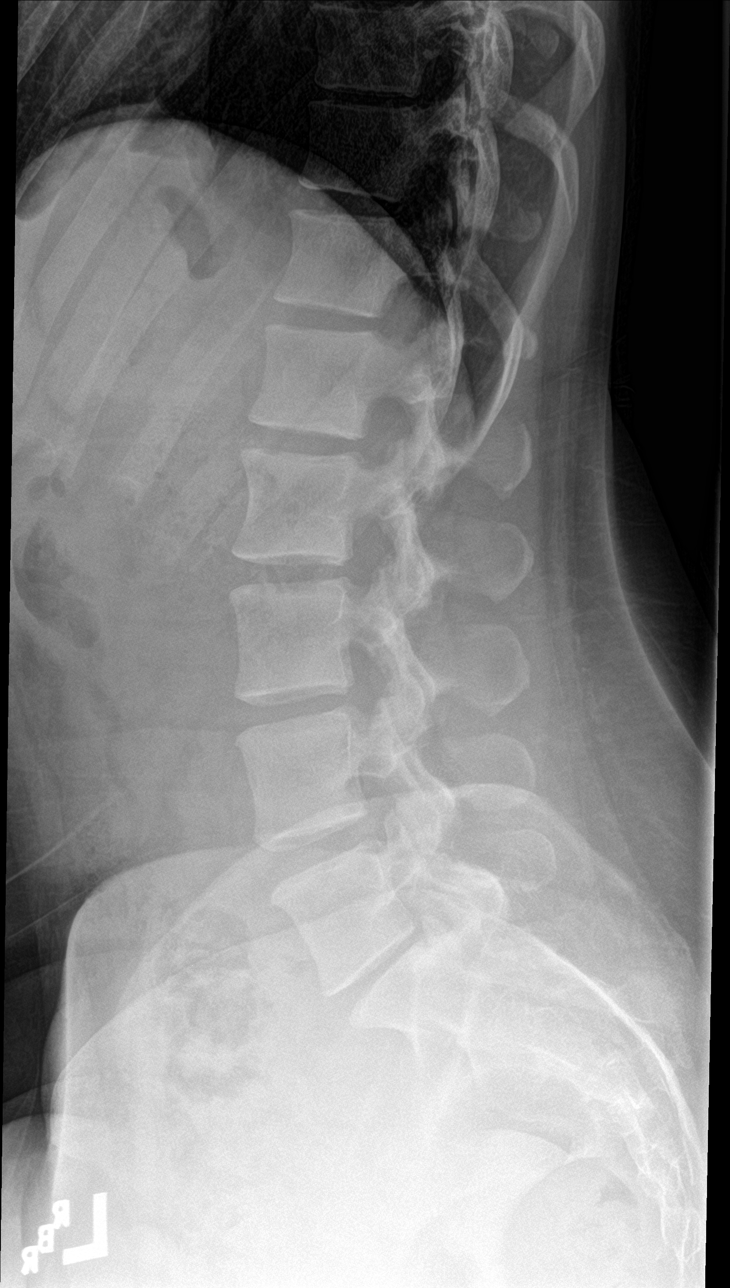

[l-spine spot]
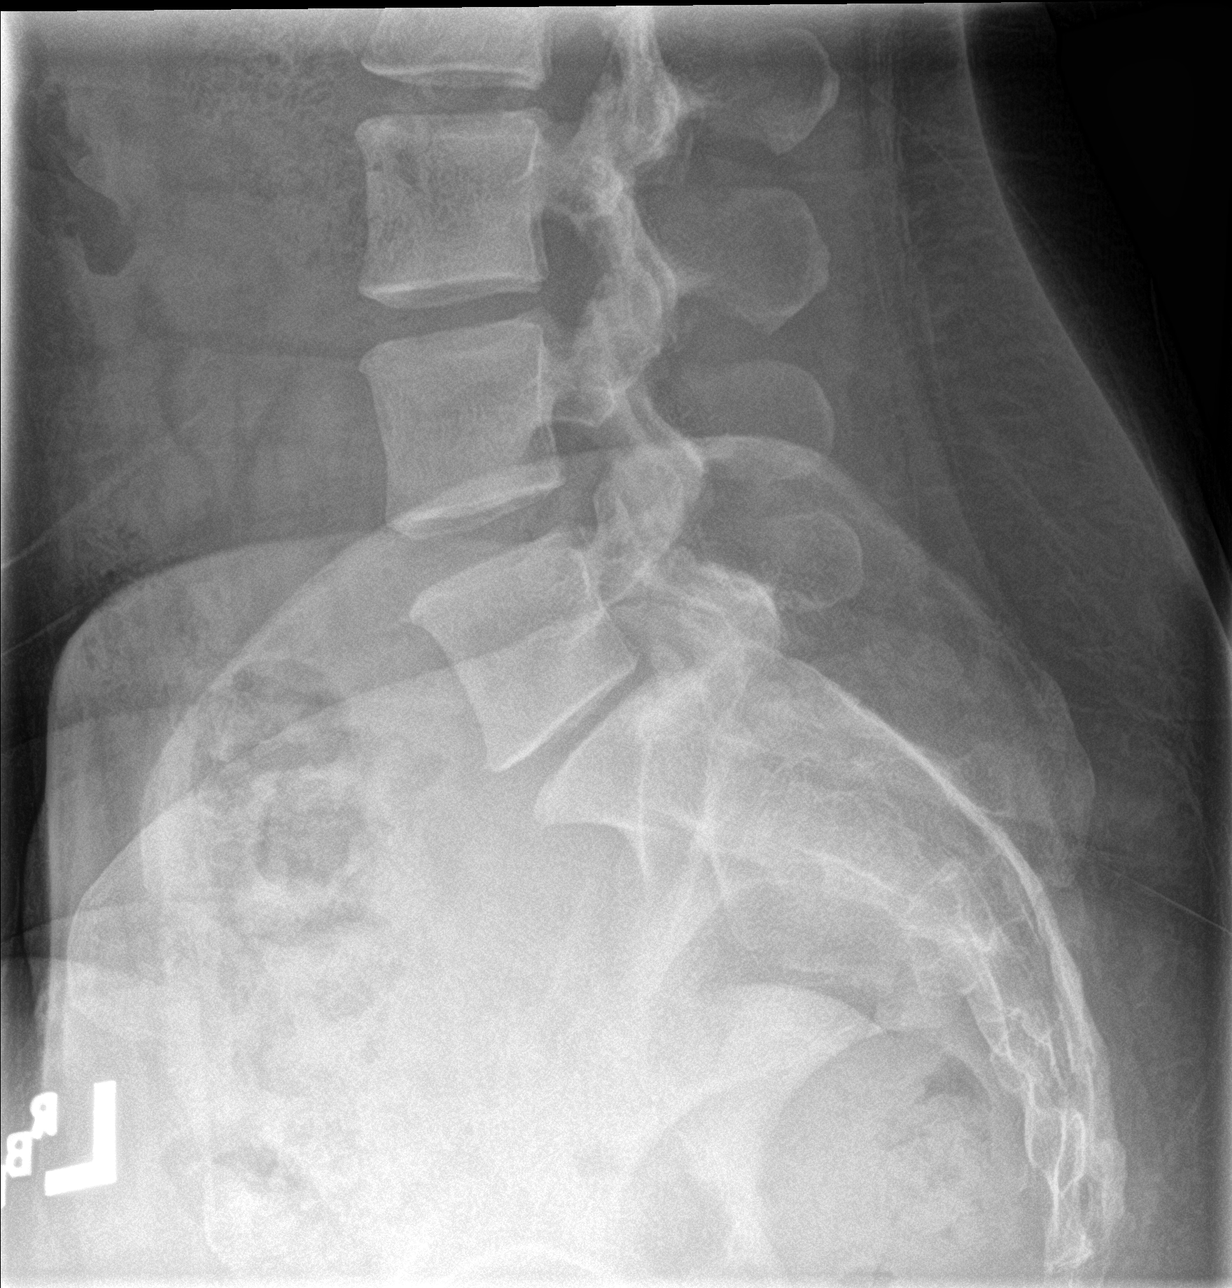

[5 of 5 positions shown; findings below may reference images not displayed]

FINDINGS: Mild disc space narrowing at L5-S1 compatible with early
degenerative disc disease. Alignment is normal. No fracture. SI
joints are symmetric and unremarkable.
IMPRESSION: Early degenerative disc disease at L5-S1.  No acute findings.

## 2017-03-17 ENCOUNTER — Ambulatory Visit: Payer: Managed Care, Other (non HMO) | Admitting: Family Medicine

## 2017-03-17 ENCOUNTER — Encounter: Payer: Self-pay | Admitting: Family Medicine

## 2017-03-17 VITALS — BP 122/71 | HR 78 | Ht 66.0 in | Wt 151.0 lb

## 2017-03-17 DIAGNOSIS — G43821 Menstrual migraine, not intractable, with status migrainosus: Secondary | ICD-10-CM

## 2017-03-17 DIAGNOSIS — F988 Other specified behavioral and emotional disorders with onset usually occurring in childhood and adolescence: Secondary | ICD-10-CM | POA: Diagnosis not present

## 2017-03-17 MED ORDER — LISDEXAMFETAMINE DIMESYLATE 50 MG PO CAPS: 50.0000 mg | ORAL_CAPSULE | ORAL | 0 refills | Status: DC

## 2017-03-17 MED ORDER — LISDEXAMFETAMINE DIMESYLATE 50 MG PO CAPS: 50.0000 mg | ORAL_CAPSULE | Freq: Every day | ORAL | 0 refills | Status: DC

## 2017-03-17 MED ORDER — SUMATRIPTAN SUCCINATE 100 MG PO TABS: 100.0000 mg | ORAL_TABLET | Freq: Once | ORAL | 3 refills | Status: DC

## 2017-03-17 NOTE — Progress Notes (Signed)
Subjective:    Patient ID: Monica BolusJessica L Santiago, female    DOB: [birth year], 42 y.o.   MRN: [id number]  HPI F/U ADHD - 3 month f/u.  She is doing well on her current regimen.  No chest pain palpitations or shortness of breath on the medication.  She feels like it is very effective.  She is also been having severe migraine right before her menstrual cycle.  This is new.  She never really had any pounds with this before.  She says it will start about 2 days before her.  She will get light sensitivity and sound sensitivity.  She will have to just lay down and put a cool rag behind her neck on her forehead and take some Advil and found to go to sleep.  She has had to leave work early because of it.  Once her menstrual cycle starts the headache goes away immediately.   Review of Systems  BP 122/71   Pulse 78   Ht 5\' 6"  (1.676 m)   Wt 151 lb (68.5 kg)   SpO2 100%   BMI 24.37 kg/m     Allergies  Allergen Reactions  . Hydrocodone-Acetaminophen Nausea And Vomiting    Past Medical History:  Diagnosis Date  . ADD (attention deficit disorder)   . Recurrent UTI    Dr Archer AsaMcCullough    Past Surgical History:  Procedure Laterality Date  . APPENDECTOMY  2005  . endometrial polyp removed  2009  . LTCS  2004  . LUMBAR MICRODISCECTOMY  10/2015   [facility]  . TONSILLECTOMY  1984    Social History   Socioeconomic History  . Marital status: Married    Spouse name: Molly MaduroRobert  . Number of children: 1  . Years of education: Not on file  . Highest education level: Not on file  Social Needs  . Financial resource strain: Not on file  . Food insecurity - worry: Not on file  . Food insecurity - inability: Not on file  . Transportation needs - medical: Not on file  . Transportation needs - non-medical: Not on file  Occupational History  . Occupation: patient Actuaryaccess    Employer: [employer]    Comment: [employer].   Tobacco Use  . Smoking status: Never  Smoker  . Smokeless tobacco: Never Used  Substance and Sexual Activity  . Alcohol use: No  . Drug use: Not on file  . Sexual activity: Yes    Partners: Female  Other Topics Concern  . Not on file  Social History Narrative   Some regular exercise.     Family History  Problem Relation Age of Onset  . Hypertension Mother   . Cancer Mother        bladder and skin  . Hyperlipidemia Mother   . Heart disease Other   . Colon cancer Maternal Grandmother   . Kidney cancer Maternal Grandmother   . Pancreatic cancer Paternal Grandfather   . COPD Maternal Grandmother   . Congestive Heart Failure Maternal Grandmother     Outpatient Encounter Medications as of 03/17/2017  Medication Sig  . lisdexamfetamine (VYVANSE) 50 MG capsule Take 1 capsule (50 mg total) by mouth every morning.  . Norgestimate-Ethinyl Estradiol Triphasic (TRI-LINYAH) 0.18/0.215/0.25 MG-35 MCG tablet Take 1 tablet by mouth daily.  . [DISCONTINUED] lisdexamfetamine (VYVANSE) 50 MG capsule Take 1 capsule (50 mg total) by mouth every morning. Ok to fill 02/06/2017  . lisdexamfetamine (VYVANSE) 50 MG capsule Take  1 capsule (50 mg total) by mouth daily. Ok to fill 04/18/2017  . lisdexamfetamine (VYVANSE) 50 MG capsule Take 1 capsule (50 mg total) by mouth daily. Ok to fill 05/15/2017  . SUMAtriptan (IMITREX) 100 MG tablet Take 1 tablet (100 mg total) by mouth once for 1 dose. May repeat in 2 hours if headache persists or recurs.   No facility-administered encounter medications on file as of 03/17/2017.           Objective:   Physical Exam  Constitutional: She is oriented to person, place, and time. She appears well-developed and well-nourished.  HENT:  Head: Normocephalic and atraumatic.  Cardiovascular: Normal rate, regular rhythm and normal heart sounds.  Pulmonary/Chest: Effort normal and breath sounds normal.  Neurological: She is alert and oriented to person, place, and time.  Skin: Skin is warm and dry.   Psychiatric: She has a normal mood and affect. Her behavior is normal.          Assessment & Plan:  ADHD -controlled.  Continue current regimen.  Medications filled for 3 months.  Okay to call for fourth month.  Follow-up in 4 months.  She does report that she had a flu vaccine done at work.  Migraine headaches-says they seem to be hormonally triggered she could skip the placebo pills at the pill last of her pill pack and just go ahead and start the new pill pack.  It may cause a little bit of irregular bleeding and spotting but might help control the headaches.  And then allow herself to actually have a menstrual cycle every 90 days.  In addition we will try Imitrex as a rescue medication particularly the Advil is not helping.

## 2017-06-23 ENCOUNTER — Ambulatory Visit: Payer: Managed Care, Other (non HMO) | Admitting: Family Medicine

## 2017-06-23 ENCOUNTER — Encounter: Payer: Self-pay | Admitting: Family Medicine

## 2017-06-23 VITALS — BP 110/71 | HR 88 | Ht 66.0 in | Wt 153.0 lb

## 2017-06-23 DIAGNOSIS — F988 Other specified behavioral and emotional disorders with onset usually occurring in childhood and adolescence: Secondary | ICD-10-CM

## 2017-06-23 DIAGNOSIS — G43821 Menstrual migraine, not intractable, with status migrainosus: Secondary | ICD-10-CM | POA: Diagnosis not present

## 2017-06-23 MED ORDER — LISDEXAMFETAMINE DIMESYLATE 50 MG PO CAPS: 50.0000 mg | ORAL_CAPSULE | Freq: Every day | ORAL | 0 refills | Status: DC

## 2017-06-23 MED ORDER — LISDEXAMFETAMINE DIMESYLATE 50 MG PO CAPS: 50.0000 mg | ORAL_CAPSULE | ORAL | 0 refills | Status: DC

## 2017-06-23 NOTE — Progress Notes (Signed)
   Subjective:    Patient ID: Monica BolusJessica L Santiago, female    DOB: Jan 14, 1976, 42 y.o.   MRN: [id number]  HPI F/U ADD -  No CP or SOB or palpitations on the medicaiton. Insurance won't cover 90 days supply. She is very happy with her current regimen.    She says her HA have been much better since starting continuous birth control.  Did try the imitrex and that worked well too. She hasn't had a bad headache in a couple of months.     Review of Systems     Objective:   Physical Exam  Constitutional: She is oriented to person, place, and time. She appears well-developed and well-nourished.  HENT:  Head: Normocephalic and atraumatic.  Cardiovascular: Normal rate, regular rhythm and normal heart sounds.  Pulmonary/Chest: Effort normal and breath sounds normal.  Neurological: She is alert and oriented to person, place, and time.  Skin: Skin is warm and dry.  Psychiatric: She has a normal mood and affect. Her behavior is normal.       Assessment & Plan:  ADD - doing well. On medication.  Normal BP. REfilled for 90 days.  Ok to call for 4th months. F/U in 4 months.    Menstrual migraines - much better controlled since staring continuous birth control.

## 2017-10-23 ENCOUNTER — Encounter: Payer: Self-pay | Admitting: Family Medicine

## 2017-10-23 ENCOUNTER — Ambulatory Visit: Payer: Managed Care, Other (non HMO) | Admitting: Family Medicine

## 2017-10-23 VITALS — BP 119/73 | HR 88 | Ht 66.0 in | Wt 154.0 lb

## 2017-10-23 DIAGNOSIS — Z3041 Encounter for surveillance of contraceptive pills: Secondary | ICD-10-CM | POA: Diagnosis not present

## 2017-10-23 DIAGNOSIS — F988 Other specified behavioral and emotional disorders with onset usually occurring in childhood and adolescence: Secondary | ICD-10-CM | POA: Diagnosis not present

## 2017-10-23 MED ORDER — LISDEXAMFETAMINE DIMESYLATE 50 MG PO CAPS: 50.0000 mg | ORAL_CAPSULE | Freq: Every day | ORAL | 0 refills | Status: DC

## 2017-10-23 MED ORDER — LISDEXAMFETAMINE DIMESYLATE 50 MG PO CAPS: 50.0000 mg | ORAL_CAPSULE | ORAL | 0 refills | Status: DC

## 2017-10-23 MED ORDER — NORGESTIM-ETH ESTRAD TRIPHASIC 0.18/0.215/0.25 MG-35 MCG PO TABS: 1.0000 | ORAL_TABLET | Freq: Every day | ORAL | 4 refills | Status: DC

## 2017-10-23 NOTE — Progress Notes (Signed)
   Subjective:    Patient ID: Monica Santiago, female    DOB: [birth year], 42 y.o.   MRN: [id number]  HPI  ADD - Reports symptoms are well controlled on current regimen. Denies any problems with insomnia, chest pain, palpitations, or SOB.  Happy on her regimen.   Contraceptive counseling-she would like a refill on her birth control she is been super happy with it.  Now that she is been taking it continuously and is having a period every 4 months that has helped her headaches tremendously.  She would like a refill for another year.  Review of Systems     Objective:   Physical Exam  Constitutional: She is oriented to person, place, and time. She appears well-developed and well-nourished.  HENT:  Head: Normocephalic and atraumatic.  Cardiovascular: Normal rate, regular rhythm and normal heart sounds.  Pulmonary/Chest: Effort normal and breath sounds normal.  Neurological: She is alert and oriented to person, place, and time.  Skin: Skin is warm and dry.  Psychiatric: She has a normal mood and affect. Her behavior is normal.         Assessment & Plan:  ADD - BP well controlled. RF meds. F/U in 4-5 months.    Contraceptive counseling - refill birth control for a year.

## 2018-03-26 ENCOUNTER — Ambulatory Visit: Payer: Managed Care, Other (non HMO) | Admitting: Family Medicine

## 2018-03-26 ENCOUNTER — Encounter: Payer: Self-pay | Admitting: Family Medicine

## 2018-03-26 VITALS — BP 125/77 | HR 85 | Ht 66.0 in | Wt 149.0 lb

## 2018-03-26 DIAGNOSIS — H01134 Eczematous dermatitis of left upper eyelid: Secondary | ICD-10-CM | POA: Diagnosis not present

## 2018-03-26 DIAGNOSIS — F988 Other specified behavioral and emotional disorders with onset usually occurring in childhood and adolescence: Secondary | ICD-10-CM

## 2018-03-26 DIAGNOSIS — L309 Dermatitis, unspecified: Secondary | ICD-10-CM | POA: Diagnosis not present

## 2018-03-26 DIAGNOSIS — H01131 Eczematous dermatitis of right upper eyelid: Secondary | ICD-10-CM | POA: Diagnosis not present

## 2018-03-26 MED ORDER — LISDEXAMFETAMINE DIMESYLATE 50 MG PO CAPS: 50.0000 mg | ORAL_CAPSULE | Freq: Every day | ORAL | 0 refills | Status: DC

## 2018-03-26 MED ORDER — LISDEXAMFETAMINE DIMESYLATE 50 MG PO CAPS: 50.0000 mg | ORAL_CAPSULE | ORAL | 0 refills | Status: DC

## 2018-03-26 NOTE — Progress Notes (Signed)
Subjective:    CC:   HPI:  ADD - Reports symptoms are well controlled on current regime. Denies any problems with insomnia, chest pain, palpitations, or SOB.    He also complains of a rash that has been coming and going on her face for the last several months.  She said she is tried changing lotions and going to dye free perfume free soaps etc.  She is tried changing her foods around.  When she talked to her mom she said that her mom has a history of eczema that she gets around her eyes and so she is wondering if it could be the same thing.  She has been using some over-the-counter cortisone.  She says when she uses it it does help but then it wants to come back very quickly.  She says it is very red and very very itchy to the point that she is actually been taking an antihistamine at bedtime because she will scratch in her sleep.  She says then the skin peels.  Review of Systems: No fevers, chills, night sweats, weight loss, chest pain, or shortness of breath.   Objective:    General: Well Developed, well nourished, and in no acute distress.  Neuro: Alert and oriented x3, extra-ocular muscles intact, sensation grossly intact.  HEENT: Normocephalic, atraumatic  Skin: Warm and dry, no rashes.  Is have a little bit of erythema on both upper eyelids and on the left facial cheek.  She does have make-up on so it is hard to get a very clear picture.  There is a little bit of dry scale to the skin as well. Cardiac: Regular rate and rhythm, no murmurs rubs or gallops, no lower extremity edema.  Respiratory: Clear to auscultation bilaterally. Not using accessory muscles, speaking in full sentences.   Impression and Recommendations:    ADD -continue current regimen tolerating well.  Blood pressure at goal.  Refills sent for the next 90 days.  When those run out she can call back for the additional refills.  Eczema/eyelid dermatitis-okay to use the over-the-counter hydrocortisone just below the  eyebrow do not put directly on the lid.  Recommend continue to moisturize well and avoid dye free perfume free lotions recommended using Eucerin, Cetaphil etc.  Avoid using any type of harsh soap etc.  Already running her humidifier.  Recommend continue the antihistamine since that does seem to be helpful.  If not improving then please let us know.

## 2018-08-25 ENCOUNTER — Ambulatory Visit: Payer: Managed Care, Other (non HMO) | Admitting: Family Medicine

## 2019-01-08 ENCOUNTER — Other Ambulatory Visit: Payer: Self-pay | Admitting: Family Medicine

## 2019-01-19 ENCOUNTER — Other Ambulatory Visit (HOSPITAL_COMMUNITY)
Admission: RE | Admit: 2019-01-19 | Discharge: 2019-01-19 | Disposition: A | Discharge status: Home / Self Care | Payer: Managed Care, Other (non HMO) | Source: Ambulatory Visit | Attending: Family Medicine | Admitting: Family Medicine

## 2019-01-19 ENCOUNTER — Other Ambulatory Visit: Payer: Self-pay

## 2019-01-19 ENCOUNTER — Ambulatory Visit (INDEPENDENT_AMBULATORY_CARE_PROVIDER_SITE_OTHER): Payer: Managed Care, Other (non HMO) | Admitting: Family Medicine

## 2019-01-19 ENCOUNTER — Other Ambulatory Visit: Payer: Self-pay | Admitting: *Deleted

## 2019-01-19 ENCOUNTER — Encounter: Payer: Self-pay | Admitting: Family Medicine

## 2019-01-19 VITALS — BP 128/79 | HR 77 | Ht 66.0 in | Wt 149.0 lb

## 2019-01-19 DIAGNOSIS — Z124 Encounter for screening for malignant neoplasm of cervix: Secondary | ICD-10-CM | POA: Insufficient documentation

## 2019-01-19 DIAGNOSIS — Z23 Encounter for immunization: Secondary | ICD-10-CM

## 2019-01-19 DIAGNOSIS — Z0000 Encounter for general adult medical examination without abnormal findings: Secondary | ICD-10-CM | POA: Diagnosis not present

## 2019-01-19 DIAGNOSIS — F988 Other specified behavioral and emotional disorders with onset usually occurring in childhood and adolescence: Secondary | ICD-10-CM | POA: Diagnosis not present

## 2019-01-19 MED ORDER — LISDEXAMFETAMINE DIMESYLATE 50 MG PO CAPS: 50.0000 mg | ORAL_CAPSULE | Freq: Every day | ORAL | 0 refills | Status: DC

## 2019-01-19 MED ORDER — LISDEXAMFETAMINE DIMESYLATE 50 MG PO CAPS: 50.0000 mg | ORAL_CAPSULE | ORAL | 0 refills | Status: DC

## 2019-01-19 NOTE — Progress Notes (Addendum)
Subjective:     Monica Santiago is a 43 y.o. female and is here for a comprehensive physical exam. The patient reports no problems. She has been under a lot of stress.  She has ben out of her ADD medication.   Social History   Socioeconomic History  . Marital status: Married    Spouse name: Herbie Baltimore  . Number of children: 1  . Years of education: Not on file  . Highest education level: Not on file  Occupational History  . Occupation: patient Stage manager: [employer]    Comment: Mining engineer.   Social Needs  . Financial resource strain: Not on file  . Food insecurity    Worry: Not on file    Inability: Not on file  . Transportation needs    Medical: Not on file    Non-medical: Not on file  Tobacco Use  . Smoking status: Never Smoker  . Smokeless tobacco: Never Used  Substance and Sexual Activity  . Alcohol use: No  . Drug use: Not on file  . Sexual activity: Yes    Partners: Female  Lifestyle  . Physical activity    Days per week: Not on file    Minutes per session: Not on file  . Stress: Not on file  Relationships  . Social Herbalist on phone: Not on file    Gets together: Not on file    Attends religious service: Not on file    Active member of club or organization: Not on file    Attends meetings of clubs or organizations: Not on file    Relationship status: Not on file  . Intimate partner violence    Fear of current or ex partner: Not on file    Emotionally abused: Not on file    Physically abused: Not on file    Forced sexual activity: Not on file  Other Topics Concern  . Not on file  Social History Narrative   Some regular exercise.    Health Maintenance  Topic Date Due  . PAP SMEAR-Modifier  03/04/2019 (Originally 11/16/2018)  . HIV Screening  04/03/2028 (Originally 07/14/1989)  . TETANUS/TDAP  01/18/2029  . INFLUENZA VACCINE  Completed    The following portions of the patient's history were reviewed and  updated as appropriate: allergies, current medications, past family history, past medical history, past social history, past surgical history and problem list.   Review of Systems A comprehensive review of systems was negative.   Objective:    BP 128/79   Pulse 77   Ht 5\' 6"  (1.676 m)   Wt 149 lb (67.6 kg)   LMP 01/15/2019 (Exact Date)   SpO2 96%   BMI 24.05 kg/m  General appearance: alert, cooperative and appears stated age Head: Normocephalic, without obvious abnormality, atraumatic Eyes: conj clear, EOM, PEERLA Ears: normal TM's and external ear canals both ears Nose: Nares normal. Septum midline. Mucosa normal. No drainage or sinus tenderness. Throat: lips, mucosa, and tongue normal; teeth and gums normal Neck: no adenopathy, no carotid bruit, no JVD, supple, symmetrical, trachea midline and thyroid not enlarged, symmetric, no tenderness/mass/nodules Back: symmetric, no curvature. ROM normal. No CVA tenderness. Lungs: clear to auscultation bilaterally Breasts: normal appearance, no masses or tenderness Heart: regular rate and rhythm, S1, S2 normal, no murmur, click, rub or gallop Abdomen: soft, non-tender; bowel sounds normal; no masses,  no organomegaly Pelvic: cervix normal in appearance, external genitalia normal, no adnexal  masses or tenderness, no cervical motion tenderness, rectovaginal septum normal, uterus normal size, shape, and consistency and vagina normal without discharge Extremities: extremities normal, atraumatic, no cyanosis or edema Pulses: 2+ and symmetric Skin: Skin color, texture, turgor normal. No rashes or lesions Lymph nodes: Cervical, supraclavicular, and axillary nodes normal. Neurologic: Alert and oriented X 3, normal strength and tone. Normal symmetric reflexes. Normal coordination and gait    Assessment:    Healthy female exam.      Plan:     See After Visit Summary for Counseling Recommendations   Keep up a regular exercise program and make  sure you are eating a healthy diet Try to eat 4 servings of dairy a day, or if you are lactose intolerant take a calcium with vitamin D daily.  Your vaccines are up to date.

## 2019-01-19 NOTE — Addendum Note (Signed)
Addended by: Beatrice Lecher D on: 01/19/2019 02:16 PM   Modules accepted: Orders

## 2019-01-19 NOTE — Patient Instructions (Signed)
Health Maintenance, Female Adopting a healthy lifestyle and getting preventive care are important in promoting health and wellness. Ask your health care provider about:  The right schedule for you to have regular tests and exams.  Things you can do on your own to prevent diseases and keep yourself healthy. What should I know about diet, weight, and exercise? Eat a healthy diet   Eat a diet that includes plenty of vegetables, fruits, low-fat dairy products, and lean protein.  Do not eat a lot of foods that are high in solid fats, added sugars, or sodium. Maintain a healthy weight Body mass index (BMI) is used to identify weight problems. It estimates body fat based on height and weight. Your health care provider can help determine your BMI and help you achieve or maintain a healthy weight. Get regular exercise Get regular exercise. This is one of the most important things you can do for your health. Most adults should:  Exercise for at least 150 minutes each week. The exercise should increase your heart rate and make you sweat (moderate-intensity exercise).  Do strengthening exercises at least twice a week. This is in addition to the moderate-intensity exercise.  Spend less time sitting. Even light physical activity can be beneficial. Watch cholesterol and blood lipids Have your blood tested for lipids and cholesterol at 44 years of age, then have this test every 5 years. Have your cholesterol levels checked more often if:  Your lipid or cholesterol levels are high.  You are older than 44 years of age.  You are at high risk for heart disease. What should I know about cancer screening? Depending on your health history and family history, you may need to have cancer screening at various ages. This may include screening for:  Breast cancer.  Cervical cancer.  Colorectal cancer.  Skin cancer.  Lung cancer. What should I know about heart disease, diabetes, and high blood  pressure? Blood pressure and heart disease  High blood pressure causes heart disease and increases the risk of stroke. This is more likely to develop in people who have high blood pressure readings, are of African descent, or are overweight.  Have your blood pressure checked: ? Every 3-5 years if you are 18-39 years of age. ? Every year if you are 40 years old or older. Diabetes Have regular diabetes screenings. This checks your fasting blood sugar level. Have the screening done:  Once every three years after age 40 if you are at a normal weight and have a low risk for diabetes.  More often and at a younger age if you are overweight or have a high risk for diabetes. What should I know about preventing infection? Hepatitis B If you have a higher risk for hepatitis B, you should be screened for this virus. Talk with your health care provider to find out if you are at risk for hepatitis B infection. Hepatitis C Testing is recommended for:  Everyone born from 1945 through 1965.  Anyone with known risk factors for hepatitis C. Sexually transmitted infections (STIs)  Get screened for STIs, including gonorrhea and chlamydia, if: ? You are sexually active and are younger than 44 years of age. ? You are older than 44 years of age and your health care provider tells you that you are at risk for this type of infection. ? Your sexual activity has changed since you were last screened, and you are at increased risk for chlamydia or gonorrhea. Ask your health care provider if   you are at risk.  Ask your health care provider about whether you are at high risk for HIV. Your health care provider may recommend a prescription medicine to help prevent HIV infection. If you choose to take medicine to prevent HIV, you should first get tested for HIV. You should then be tested every 3 months for as long as you are taking the medicine. Pregnancy  If you are about to stop having your period (premenopausal) and  you may become pregnant, seek counseling before you get pregnant.  Take 400 to 800 micrograms (mcg) of folic acid every day if you become pregnant.  Ask for birth control (contraception) if you want to prevent pregnancy. Osteoporosis and menopause Osteoporosis is a disease in which the bones lose minerals and strength with aging. This can result in bone fractures. If you are 65 years old or older, or if you are at risk for osteoporosis and fractures, ask your health care provider if you should:  Be screened for bone loss.  Take a calcium or vitamin D supplement to lower your risk of fractures.  Be given hormone replacement therapy (HRT) to treat symptoms of menopause. Follow these instructions at home: Lifestyle  Do not use any products that contain nicotine or tobacco, such as cigarettes, e-cigarettes, and chewing tobacco. If you need help quitting, ask your health care provider.  Do not use street drugs.  Do not share needles.  Ask your health care provider for help if you need support or information about quitting drugs. Alcohol use  Do not drink alcohol if: ? Your health care provider tells you not to drink. ? You are pregnant, may be pregnant, or are planning to become pregnant.  If you drink alcohol: ? Limit how much you use to 0-1 drink a day. ? Limit intake if you are breastfeeding.  Be aware of how much alcohol is in your drink. In the [place] one drink equals one 12 oz bottle of beer (355 mL), one 5 oz glass of wine (148 mL), or one 1 oz glass of hard liquor (44 mL). General instructions  Schedule regular health, dental, and eye exams.  Stay current with your vaccines.  Tell your health care provider if: ? You often feel depressed. ? You have ever been abused or do not feel safe at home. Summary  Adopting a healthy lifestyle and getting preventive care are important in promoting health and wellness.  Follow your health care provider's instructions about healthy  diet, exercising, and getting tested or screened for diseases.  Follow your health care provider's instructions on monitoring your cholesterol and blood pressure. This information is not intended to replace advice given to you by your health care provider. Make sure you discuss any questions you have with your health care provider. Document Released: 09/03/2010 Document Revised: 02/11/2018 Document Reviewed: 02/11/2018 Elsevier Patient Education  2020 Elsevier Inc.  

## 2019-01-21 LAB — CYTOLOGY - PAP
Comment: NEGATIVE
Diagnosis: NEGATIVE
High risk HPV: NEGATIVE

## 2019-01-22 NOTE — Progress Notes (Signed)
Call patient: Your Pap smear is normal. Repeat in 5 years.

## 2019-04-28 ENCOUNTER — Other Ambulatory Visit: Payer: Self-pay

## 2019-04-28 DIAGNOSIS — F988 Other specified behavioral and emotional disorders with onset usually occurring in childhood and adolescence: Secondary | ICD-10-CM

## 2019-04-28 MED ORDER — LISDEXAMFETAMINE DIMESYLATE 50 MG PO CAPS: 50.0000 mg | ORAL_CAPSULE | ORAL | 0 refills | Status: DC

## 2019-04-28 NOTE — Telephone Encounter (Signed)
Pt called requesting med refill for vyvanse rx. Last visit with provider was 01/19/19. Last med refill was 03/19/19. Pt has an upcoming appt on 05/24/19. Requesting refill be sent to [facility] pharmacy in [place].

## 2019-04-29 NOTE — Telephone Encounter (Signed)
Left a vm msg for pt regarding med refill sent to local pharmacy. Direct call back info provided.

## 2019-05-24 ENCOUNTER — Other Ambulatory Visit: Payer: Self-pay

## 2019-05-24 ENCOUNTER — Ambulatory Visit: Payer: Managed Care, Other (non HMO) | Admitting: Family Medicine

## 2019-05-24 ENCOUNTER — Encounter: Payer: Self-pay | Admitting: Family Medicine

## 2019-05-24 VITALS — BP 125/83 | HR 104 | Ht 66.0 in | Wt 153.0 lb

## 2019-05-24 DIAGNOSIS — F988 Other specified behavioral and emotional disorders with onset usually occurring in childhood and adolescence: Secondary | ICD-10-CM | POA: Diagnosis not present

## 2019-05-24 DIAGNOSIS — G43821 Menstrual migraine, not intractable, with status migrainosus: Secondary | ICD-10-CM | POA: Diagnosis not present

## 2019-05-24 MED ORDER — LISDEXAMFETAMINE DIMESYLATE 50 MG PO CAPS: 50.0000 mg | ORAL_CAPSULE | Freq: Every day | ORAL | 0 refills | Status: DC

## 2019-05-24 MED ORDER — LISDEXAMFETAMINE DIMESYLATE 50 MG PO CAPS: 50.0000 mg | ORAL_CAPSULE | ORAL | 0 refills | Status: DC

## 2019-05-24 NOTE — Assessment & Plan Note (Signed)
Migraines have been much better now that she is on the Tri-Sprintec birth control.  She now only has a period about every 3 months and says they do not seem to be nearly as intense during that time so she has not had to use the Imitrex very often she does not need any refills today.

## 2019-05-24 NOTE — Progress Notes (Signed)
Established Patient Office Visit  Subjective:  Patient ID: Monica Santiago, female    DOB: 10-05-1975  Age: 44 y.o. MRN: [id number]  CC:  Chief Complaint  Patient presents with  . ADD    HPI Monica Santiago presents for  ADD - Reports symptoms are well controlled on current regime. Denies any problems with insomnia, chest pain, palpitations, or SOB.     Past Medical History:  Diagnosis Date  . ADD (attention deficit disorder)   . Recurrent UTI    Dr Archer Asa    Past Surgical History:  Procedure Laterality Date  . APPENDECTOMY  2005  . endometrial polyp removed  2009  . LTCS  2004  . LUMBAR MICRODISCECTOMY  10/2015   Dr. Alden Hipp at [place]  . TONSILLECTOMY  1984    Family History  Problem Relation Age of Onset  . Hypertension Mother   . Cancer Mother        bladder and skin  . Hyperlipidemia Mother   . Heart disease Other   . Colon cancer Maternal Grandmother   . Kidney cancer Maternal Grandmother   . Pancreatic cancer Paternal Grandfather   . COPD Maternal Grandmother   . Congestive Heart Failure Maternal Grandmother     Social History   Socioeconomic History  . Marital status: Married    Spouse name: [name]  . Number of children: 1  . Years of education: Not on file  . Highest education level: Not on file  Occupational History  . 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   Tobacco Use  . Smoking status: Never Smoker  . Smokeless tobacco: Never Used  Substance and Sexual Activity  . Alcohol use: No  . Drug use: Not on file  . Sexual activity: Yes    Partners: Female  Other Topics Concern  . Not on file  Social History Narrative   Some regular exercise.    Social Determinants of Health   Financial Resource Strain:   . Difficulty of Paying Living Expenses:   Food Insecurity:   . Worried About Programme researcher, broadcasting/film/video in the Last Year:   . Barista in the Last Year:    Transportation Needs:   . Freight forwarder (Medical):   Marland Kitchen Lack of Transportation (Non-Medical):   Physical Activity:   . Days of Exercise per Week:   . Minutes of Exercise per Session:   Stress:   . Feeling of Stress :   Social Connections:   . Frequency of Communication with Friends and Family:   . Frequency of Social Gatherings with Friends and Family:   . Attends Religious Services:   . Active Member of Clubs or Organizations:   . Attends Banker Meetings:   Marland Kitchen Marital Status:   Intimate Partner Violence:   . Fear of Current or Ex-Partner:   . Emotionally Abused:   Marland Kitchen Physically Abused:   . Sexually Abused:     Outpatient Medications Prior to Visit  Medication Sig Dispense Refill  . SUMAtriptan (IMITREX) 100 MG tablet Take 100 mg by mouth. Take 1 tablet (100 mg total) by mouth once for 1 dose. May repeat in 2 hours if headache persists or recurs.    . TRI-SPRINTEC 0.18/0.215/0.25 MG-35 MCG tablet TAKE 1 TABLET BY MOUTH DAILY 84 tablet 4  . lisdexamfetamine (VYVANSE) 50 MG capsule Take 1 capsule (50 mg total) by mouth daily. 30 capsule  0  . lisdexamfetamine (VYVANSE) 50 MG capsule Take 1 capsule (50 mg total) by mouth daily. 30 capsule 0  . lisdexamfetamine (VYVANSE) 50 MG capsule Take 1 capsule (50 mg total) by mouth every morning. 30 capsule 0   No facility-administered medications prior to visit.    Allergies  Allergen Reactions  . Hydrocodone-Acetaminophen Nausea And Vomiting    ROS Review of Systems    Objective:    Physical Exam  Constitutional: She is oriented to person, place, and time. She appears well-developed and well-nourished.  HENT:  Head: Normocephalic and atraumatic.  Cardiovascular: Normal rate, regular rhythm and normal heart sounds.  Pulmonary/Chest: Effort normal and breath sounds normal.  Neurological: She is alert and oriented to person, place, and time.  Skin: Skin is warm and dry.  Psychiatric: She has a normal mood and  affect. Her behavior is normal.    BP 125/83   Pulse (!) 104   Ht 5\' 6"  (1.676 m)   Wt 153 lb (69.4 kg)   LMP 04/29/2019 (Exact Date)   SpO2 100%   BMI 24.69 kg/m  Wt Readings from Last 3 Encounters:  05/24/19 153 lb (69.4 kg)  01/19/19 149 lb (67.6 kg)  03/26/18 149 lb (67.6 kg)     There are no preventive care reminders to display for this patient.  There are no preventive care reminders to display for this patient.  Lab Results  Component Value Date   TSH 1.122 11/04/2014   Lab Results  Component Value Date   WBC 6.2 11/04/2014   HGB 14.1 11/04/2014   HCT 41.5 11/04/2014   MCV 91.4 11/04/2014   PLT 216 11/04/2014   Lab Results  Component Value Date   NA 139 11/04/2014   K 3.7 11/04/2014   CO2 27 11/04/2014   GLUCOSE 73 11/04/2014   BUN 11 11/04/2014   CREATININE 0.62 11/04/2014   BILITOT 0.6 11/04/2014   ALKPHOS 33 11/04/2014   AST 16 11/04/2014   ALT 15 11/04/2014   PROT 6.5 11/04/2014   ALBUMIN 3.9 11/04/2014   CALCIUM 9.2 11/04/2014   Lab Results  Component Value Date   CHOL 154 12/03/2012   Lab Results  Component Value Date   HDL 59 12/03/2012   Lab Results  Component Value Date   LDLCALC 78 12/03/2012   Lab Results  Component Value Date   TRIG 87 12/03/2012   Lab Results  Component Value Date   CHOLHDL 2.6 12/03/2012   No results found for: HGBA1C    Assessment & Plan:   Problem List Items Addressed This Visit      Cardiovascular and Mediastinum   Menstrual migraine with status migrainosus, not intractable    Migraines have been much better now that she is on the Tri-Sprintec birth control.  She now only has a period about every 3 months and says they do not seem to be nearly as intense during that time so she has not had to use the Imitrex very often she does not need any refills today.        Other   Attention deficit disorder (ADD) - Primary    Doing well on current regimen.  Refill sent for the next 90 days.  Follow-up  in 6 months.      Relevant Medications   lisdexamfetamine (VYVANSE) 50 MG capsule (Start on 07/22/2019)   lisdexamfetamine (VYVANSE) 50 MG capsule (Start on 06/23/2019)   lisdexamfetamine (VYVANSE) 50 MG capsule     Did remind  her to schedule her mammogram she has not had her first one yet but does turn 75 in May she says she will get that done at [facility] because it is better covered there.  Meds ordered this encounter  Medications  . lisdexamfetamine (VYVANSE) 50 MG capsule    Sig: Take 1 capsule (50 mg total) by mouth daily.    Dispense:  30 capsule    Refill:  0  . lisdexamfetamine (VYVANSE) 50 MG capsule    Sig: Take 1 capsule (50 mg total) by mouth daily.    Dispense:  30 capsule    Refill:  0  . lisdexamfetamine (VYVANSE) 50 MG capsule    Sig: Take 1 capsule (50 mg total) by mouth every morning.    Dispense:  30 capsule    Refill:  0    Follow-up: Return in about 6 months (around 11/24/2019) for ADD.    Beatrice Lecher, MD

## 2019-05-24 NOTE — Assessment & Plan Note (Signed)
Doing well on current regimen.  Refill sent for the next 90 days.  Follow-up in 6 months. 

## 2019-06-22 LAB — HM MAMMOGRAPHY

## 2019-06-25 ENCOUNTER — Encounter: Payer: Self-pay | Admitting: Family Medicine

## 2019-06-29 LAB — HM MAMMOGRAPHY

## 2019-07-05 ENCOUNTER — Encounter: Payer: Self-pay | Admitting: Family Medicine

## 2019-08-31 DIAGNOSIS — M4802 Spinal stenosis, cervical region: Secondary | ICD-10-CM | POA: Insufficient documentation

## 2019-09-10 ENCOUNTER — Telehealth: Payer: Self-pay

## 2019-09-10 DIAGNOSIS — F988 Other specified behavioral and emotional disorders with onset usually occurring in childhood and adolescence: Secondary | ICD-10-CM

## 2019-09-10 MED ORDER — LISDEXAMFETAMINE DIMESYLATE 50 MG PO CAPS: 50.0000 mg | ORAL_CAPSULE | Freq: Every day | ORAL | 0 refills | Status: DC

## 2019-09-10 MED ORDER — LISDEXAMFETAMINE DIMESYLATE 50 MG PO CAPS: 50.0000 mg | ORAL_CAPSULE | ORAL | 0 refills | Status: DC

## 2019-09-10 NOTE — Telephone Encounter (Signed)
Pt called requesting med refills for vyvanse. Pls send to [facility] pharmacy.   Last OV - 05/24/19 Last written - 07/22/19

## 2019-09-10 NOTE — Telephone Encounter (Signed)
Medication sent.

## 2019-09-13 NOTE — Telephone Encounter (Signed)
Left a vm msg for pt regarding med refill sent to local pharmacy. Direct contact info provided.

## 2019-11-25 ENCOUNTER — Encounter: Payer: Self-pay | Admitting: Family Medicine

## 2019-11-25 ENCOUNTER — Other Ambulatory Visit: Payer: Self-pay

## 2019-11-25 ENCOUNTER — Ambulatory Visit: Payer: Managed Care, Other (non HMO) | Admitting: Family Medicine

## 2019-11-25 VITALS — BP 120/77 | HR 101 | Ht 66.0 in | Wt 157.0 lb

## 2019-11-25 DIAGNOSIS — R454 Irritability and anger: Secondary | ICD-10-CM | POA: Diagnosis not present

## 2019-11-25 DIAGNOSIS — Z13220 Encounter for screening for lipoid disorders: Secondary | ICD-10-CM | POA: Diagnosis not present

## 2019-11-25 DIAGNOSIS — F988 Other specified behavioral and emotional disorders with onset usually occurring in childhood and adolescence: Secondary | ICD-10-CM | POA: Diagnosis not present

## 2019-11-25 MED ORDER — LISDEXAMFETAMINE DIMESYLATE 70 MG PO CAPS: 70.0000 mg | ORAL_CAPSULE | Freq: Every day | ORAL | 0 refills | Status: DC

## 2019-11-25 NOTE — Progress Notes (Signed)
Pt stated that she had surgery on her neck on June 30th. During her time of recovery until end of July she hadn't been taking the Vyvanse.   She said that her family and coworkers noticed that she has been really agitated, (very snappy espescially at home). Her husband told her that she has become loud when she talks.  She questions whether or not its due to her recent surgery. She said that she wasn't like that prior.

## 2019-11-25 NOTE — Assessment & Plan Note (Addendum)
Increased irritability since having surgery at the end of June unclear etiology at this point.  No significant underlying mood change.  Certainly could be her ADHD but I am not sure why her medication is not working well all of a sudden.  Especially since she had a washout.  During her surgery.  It is a little unusual so if adjusting her ADD medication does not improve her symptoms the consider other alternatives such as electrolyte disturbance, thyroid disorder, possible stroke during surgery.  GAD-7 score of 11.

## 2019-11-25 NOTE — Assessment & Plan Note (Signed)
Adjusting her medication will switch to Vyvanse any milligrams.  We will try this for 1 month.  Discussed that really within a week she should know if it is helping or not helping and let me know.  If it is not helping then we may need to adjust her regimen she will need to watch out for chest pain, palpitations, shortness of breath, and insomnia on the increase stimulant dose.  Her blood pressure is well controlled.  Also on go ahead and get up-to-date blood work today.

## 2019-11-25 NOTE — Progress Notes (Signed)
Established Patient Office Visit  Subjective:  Patient ID: Monica Santiago, female    DOB: [birth year]  Age: 44 y.o. MRN: [id number]  CC:  Chief Complaint  Patient presents with  . ADD    HPI [name] presents for   ADD - Reports symptoms are well controlled on current regime. Denies any problems with insomnia, chest pain, palpitations, or SOB.     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 Alden Hipp.   He says ever since her surgery she is just felt more on edge and more irritable she says she has not really noticed herself that family members including her husband son and even coworkers have blood work pointed out to her.  They feel like she is not on task she is talking loudly and getting easily annoyed by things.  She feels like her mood overall is okay she does not feel down or depressed or hopeless she does not feel overly anxious.  She reports that she is got great sleep quality.  She has not had any other significant personality changes etc. feels like some of the symptoms are related to her ADHD such as talking over top of other people.  None of her medications have changed.  She stopped her Vyvanse for a while during surgery but restarted it again at the end of July.  She says she will feel like it works for little while but then wears off very quickly.  Past Medical History:  Diagnosis Date  . ADD (attention deficit disorder)   . Recurrent UTI    Dr Archer Asa    Past Surgical History:  Procedure Laterality Date  . APPENDECTOMY  2005  . endometrial polyp removed  2009  . LTCS  2004  . LUMBAR MICRODISCECTOMY  10/2015   Dr. Alden Hipp at [facility]  . TONSILLECTOMY  1984    Family History  Problem Relation Age of Onset  . Hypertension Mother   . Cancer Mother        bladder and skin  . Hyperlipidemia Mother   . Heart disease Other   . Colon cancer Maternal Grandmother   . Kidney  cancer Maternal Grandmother   . Pancreatic cancer Paternal Grandfather   . COPD Maternal Grandmother   . Congestive Heart Failure Maternal Grandmother     Social History   Socioeconomic History  . Marital status: Married    Spouse name: [name]  . Number of children: 1  . Years of education: Not on file  . Highest education level: Not on file  Occupational History  . 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   Tobacco Use  . Smoking status: Never Smoker  . Smokeless tobacco: Never Used  Substance and Sexual Activity  . Alcohol use: No  . Drug use: Not on file  . Sexual activity: Yes    Partners: Female  Other Topics Concern  . Not on file  Social History Narrative   Some regular exercise.    Social Determinants of Health   Financial Resource Strain:   . Difficulty of Paying Living Expenses: Not on file  Food Insecurity:   . Worried About Programme researcher, broadcasting/film/video in the Last Year: Not on file  . Ran Out of Food in the Last Year: Not on file  Transportation Needs:   . Lack of Transportation (  Medical): Not on file  . Lack of Transportation (Non-Medical): Not on file  Physical Activity:   . Days of Exercise per Week: Not on file  . Minutes of Exercise per Session: Not on file  Stress:   . Feeling of Stress : Not on file  Social Connections:   . Frequency of Communication with Friends and Family: Not on file  . Frequency of Social Gatherings with Friends and Family: Not on file  . Attends Religious Services: Not on file  . Active Member of Clubs or Organizations: Not on file  . Attends Banker Meetings: Not on file  . Marital Status: Not on file  Intimate Partner Violence:   . Fear of Current or Ex-Partner: Not on file  . Emotionally Abused: Not on file  . Physically Abused: Not on file  . Sexually Abused: Not on file    Outpatient Medications Prior to Visit  Medication Sig Dispense Refill  .  lisdexamfetamine (VYVANSE) 50 MG capsule Take 1 capsule (50 mg total) by mouth daily. 30 capsule 0  . lisdexamfetamine (VYVANSE) 50 MG capsule Take 1 capsule (50 mg total) by mouth every morning. 30 capsule 0  . SUMAtriptan (IMITREX) 100 MG tablet Take 100 mg by mouth. Take 1 tablet (100 mg total) by mouth once for 1 dose. May repeat in 2 hours if headache persists or recurs.    . TRI-SPRINTEC 0.18/0.215/0.25 MG-35 MCG tablet TAKE 1 TABLET BY MOUTH DAILY 84 tablet 4  . lisdexamfetamine (VYVANSE) 50 MG capsule Take 1 capsule (50 mg total) by mouth daily. 30 capsule 0   No facility-administered medications prior to visit.    Allergies  Allergen Reactions  . Hydrocodone-Acetaminophen Nausea And Vomiting    ROS Review of Systems    Objective:    Physical Exam Constitutional:      Appearance: She is well-developed.  HENT:     Head: Normocephalic and atraumatic.  Cardiovascular:     Rate and Rhythm: Normal rate and regular rhythm.     Heart sounds: Normal heart sounds.  Pulmonary:     Effort: Pulmonary effort is normal.     Breath sounds: Normal breath sounds.  Skin:    General: Skin is warm and dry.  Neurological:     Mental Status: She is alert and oriented to person, place, and time.  Psychiatric:        Behavior: Behavior normal.     BP 120/77   Pulse (!) 101   Ht 5\' 6"  (1.676 m)   Wt 157 lb (71.2 kg)   SpO2 98%   BMI 25.34 kg/m  Wt Readings from Last 3 Encounters:  11/25/19 157 lb (71.2 kg)  05/24/19 153 lb (69.4 kg)  01/19/19 149 lb (67.6 kg)     Health Maintenance Due  Topic Date Due  . Hepatitis C Screening  Never done    There are no preventive care reminders to display for this patient.  Lab Results  Component Value Date   TSH 1.122 11/04/2014   Lab Results  Component Value Date   WBC 6.2 11/04/2014   HGB 14.1 11/04/2014   HCT 41.5 11/04/2014   MCV 91.4 11/04/2014   PLT 216 11/04/2014   Lab Results  Component Value Date   NA 139  11/04/2014   K 3.7 11/04/2014   CO2 27 11/04/2014   GLUCOSE 73 11/04/2014   BUN 11 11/04/2014   CREATININE 0.62 11/04/2014   BILITOT 0.6 11/04/2014   ALKPHOS 33  11/04/2014   AST 16 11/04/2014   ALT 15 11/04/2014   PROT 6.5 11/04/2014   ALBUMIN 3.9 11/04/2014   CALCIUM 9.2 11/04/2014   Lab Results  Component Value Date   CHOL 154 12/03/2012   Lab Results  Component Value Date   HDL 59 12/03/2012   Lab Results  Component Value Date   LDLCALC 78 12/03/2012   Lab Results  Component Value Date   TRIG 87 12/03/2012   Lab Results  Component Value Date   CHOLHDL 2.6 12/03/2012   No results found for: HGBA1C    Assessment & Plan:   Problem List Items Addressed This Visit      Other   Screening, lipid   Relevant Orders   Lipid panel   Irritability    Increased irritability since having surgery at the end of June unclear etiology at this point.  No significant underlying mood change.  Certainly could be her ADHD but I am not sure why her medication is not working well all of a sudden.  Especially since she had a washout.  During her surgery.  It is a little unusual so if adjusting her ADD medication does not improve her symptoms the consider other alternatives such as electrolyte disturbance, thyroid disorder, possible stroke during surgery.  GAD-7 score of 11.      Attention deficit disorder (ADD) - Primary    Adjusting her medication will switch to Vyvanse any milligrams.  We will try this for 1 month.  Discussed that really within a week she should know if it is helping or not helping and let me know.  If it is not helping then we may need to adjust her regimen she will need to watch out for chest pain, palpitations, shortness of breath, and insomnia on the increase stimulant dose.  Her blood pressure is well controlled.  Also on go ahead and get up-to-date blood work today.      Relevant Medications   lisdexamfetamine (VYVANSE) 70 MG capsule   Other Relevant Orders    CBC   COMPLETE METABOLIC PANEL WITH GFR   Lipid panel   TSH      Meds ordered this encounter  Medications  . lisdexamfetamine (VYVANSE) 70 MG capsule    Sig: Take 1 capsule (70 mg total) by mouth daily.    Dispense:  30 capsule    Refill:  0    Follow-up: Return in about 4 weeks (around 12/23/2019) for New start medication.    Nani Gasser, MD

## 2019-12-21 ENCOUNTER — Other Ambulatory Visit: Payer: Self-pay

## 2019-12-21 ENCOUNTER — Ambulatory Visit (INDEPENDENT_AMBULATORY_CARE_PROVIDER_SITE_OTHER): Payer: Managed Care, Other (non HMO) | Admitting: Family Medicine

## 2019-12-21 ENCOUNTER — Encounter: Payer: Self-pay | Admitting: Family Medicine

## 2019-12-21 VITALS — BP 126/75 | HR 92 | Ht 66.0 in | Wt 156.0 lb

## 2019-12-21 DIAGNOSIS — F988 Other specified behavioral and emotional disorders with onset usually occurring in childhood and adolescence: Secondary | ICD-10-CM | POA: Diagnosis not present

## 2019-12-21 DIAGNOSIS — M2550 Pain in unspecified joint: Secondary | ICD-10-CM

## 2019-12-21 MED ORDER — LISDEXAMFETAMINE DIMESYLATE 70 MG PO CAPS: 70.0000 mg | ORAL_CAPSULE | Freq: Every day | ORAL | 0 refills | Status: DC

## 2019-12-21 MED ORDER — LISDEXAMFETAMINE DIMESYLATE 70 MG PO CAPS: 70.0000 mg | ORAL_CAPSULE | ORAL | 0 refills | Status: DC

## 2019-12-21 NOTE — Assessment & Plan Note (Signed)
She is actually doing really well on the increased regimen.  She is also completely asymptomatic blood pressure is well controlled she is not tachycardic she is not having any other symptoms.  We will continue current regimen and and see her back in 3 months to make sure that she is doing well.

## 2019-12-21 NOTE — Progress Notes (Signed)
Doing well on current regimen .  

## 2019-12-21 NOTE — Assessment & Plan Note (Signed)
I do think it is worth doing some additional work-up for autoimmune type arthritis.  We will get labs today and call with results once available it is a little bit unusual that she is already had a low back and cervical spine surgery.

## 2019-12-21 NOTE — Progress Notes (Signed)
Established Patient Office Visit  Subjective:  Patient ID: Monica Santiago, female    DOB: Jan 18, 1976  Age: 44 y.o. MRN: [id number]  CC:  Chief Complaint  Patient presents with  . Follow-up    HPI Monica Santiago presents for follow-up of ADD and mood.  When I last saw her she was dealing so with some irritability and feeling like some of her ADD symptoms had really ramped up.  We discussed options including treating mood specifically or trying to adjust her Vyvanse she wanted to start by adjusting her Vyvanse first.  Actually doing really really well she feels like it has completely resolved some of her irritability that she was experiencing she says it has not affected her sleep whatsoever.  Her appetite still been good.  She has been much more at ease and calm and feeling like she can process things before she speaks its been helpful at work as well.  She also wants to know if she could have some type of underlying autoimmune type arthritis.  She is already had to have a low back surgery in her 89s and now having to have the sudden surgery on her cervical spine without any type of trauma or injury and without even really knowing what was going on.  He says both of her grandmothers had psoriasis and she has a maternal aunt who has psoriasis and rheumatoid arthritis.  She has noticed some intermittent pain in her hands and occasional swelling.  The usually is the whole finger that swollen.  She has not really had any pain or problems in her hips knees or feet.  Past Medical History:  Diagnosis Date  . ADD (attention deficit disorder)   . Recurrent UTI    Dr Archer Asa    Past Surgical History:  Procedure Laterality Date  . APPENDECTOMY  2005  . endometrial polyp removed  2009  . LTCS  2004  . LUMBAR MICRODISCECTOMY  10/2015   Dr. Alden Hipp at [place]  . TONSILLECTOMY  1984    Family History  Problem Relation Age of Onset  . Hypertension Mother   . Cancer Mother         bladder and skin  . Hyperlipidemia Mother   . Heart disease Other   . Colon cancer Maternal Grandmother   . Kidney cancer Maternal Grandmother   . Pancreatic cancer Paternal Grandfather   . COPD Maternal Grandmother   . Congestive Heart Failure Maternal Grandmother     Social History   Socioeconomic History  . Marital status: Married    Spouse name: [name]  . Number of children: 1  . Years of education: Not on file  . Highest education level: Not on file  Occupational History  . 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   Tobacco Use  . Smoking status: Never Smoker  . Smokeless tobacco: Never Used  Substance and Sexual Activity  . Alcohol use: No  . Drug use: Not on file  . Sexual activity: Yes    Partners: Female  Other Topics Concern  . Not on file  Social History Narrative   Some regular exercise.    Social Determinants of Health   Financial Resource Strain:   . Difficulty of Paying Living Expenses: Not on file  Food Insecurity:   . Worried About Programme researcher, broadcasting/film/video in the Last Year: Not on file  . Ran Out of Food in the Last  Year: Not on file  Transportation Needs:   . Lack of Transportation (Medical): Not on file  . Lack of Transportation (Non-Medical): Not on file  Physical Activity:   . Days of Exercise per Week: Not on file  . Minutes of Exercise per Session: Not on file  Stress:   . Feeling of Stress : Not on file  Social Connections:   . Frequency of Communication with Friends and Family: Not on file  . Frequency of Social Gatherings with Friends and Family: Not on file  . Attends Religious Services: Not on file  . Active Member of Clubs or Organizations: Not on file  . Attends Banker Meetings: Not on file  . Marital Status: Not on file  Intimate Partner Violence:   . Fear of Current or Ex-Partner: Not on file  . Emotionally Abused: Not on file  . Physically Abused: Not on  file  . Sexually Abused: Not on file    Outpatient Medications Prior to Visit  Medication Sig Dispense Refill  . SUMAtriptan (IMITREX) 100 MG tablet Take 100 mg by mouth. Take 1 tablet (100 mg total) by mouth once for 1 dose. May repeat in 2 hours if headache persists or recurs.    . TRI-SPRINTEC 0.18/0.215/0.25 MG-35 MCG tablet TAKE 1 TABLET BY MOUTH DAILY 84 tablet 4  . lisdexamfetamine (VYVANSE) 50 MG capsule Take 1 capsule (50 mg total) by mouth daily. 30 capsule 0  . lisdexamfetamine (VYVANSE) 50 MG capsule Take 1 capsule (50 mg total) by mouth every morning. 30 capsule 0  . lisdexamfetamine (VYVANSE) 70 MG capsule Take 1 capsule (70 mg total) by mouth daily. 30 capsule 0   No facility-administered medications prior to visit.    Allergies  Allergen Reactions  . Hydrocodone-Acetaminophen Nausea And Vomiting    ROS Review of Systems    Objective:    Physical Exam Constitutional:      Appearance: She is well-developed.  HENT:     Head: Normocephalic and atraumatic.  Cardiovascular:     Rate and Rhythm: Normal rate and regular rhythm.     Heart sounds: Normal heart sounds.  Pulmonary:     Effort: Pulmonary effort is normal.     Breath sounds: Normal breath sounds.  Musculoskeletal:     Comments: No discrete joint swelling on her hands today.  Skin:    General: Skin is warm and dry.  Neurological:     Mental Status: She is alert and oriented to person, place, and time.  Psychiatric:        Behavior: Behavior normal.     BP 126/75   Pulse 92   Ht 5\' 6"  (1.676 m)   Wt 156 lb (70.8 kg)   SpO2 100%   BMI 25.18 kg/m  Wt Readings from Last 3 Encounters:  12/21/19 156 lb (70.8 kg)  11/25/19 157 lb (71.2 kg)  05/24/19 153 lb (69.4 kg)     Health Maintenance Due  Topic Date Due  . Hepatitis C Screening  Never done    There are no preventive care reminders to display for this patient.  Lab Results  Component Value Date   TSH 1.122 11/04/2014   Lab  Results  Component Value Date   WBC 6.2 11/04/2014   HGB 14.1 11/04/2014   HCT 41.5 11/04/2014   MCV 91.4 11/04/2014   PLT 216 11/04/2014   Lab Results  Component Value Date   NA 139 11/04/2014   K 3.7 11/04/2014  CO2 27 11/04/2014   GLUCOSE 73 11/04/2014   BUN 11 11/04/2014   CREATININE 0.62 11/04/2014   BILITOT 0.6 11/04/2014   ALKPHOS 33 11/04/2014   AST 16 11/04/2014   ALT 15 11/04/2014   PROT 6.5 11/04/2014   ALBUMIN 3.9 11/04/2014   CALCIUM 9.2 11/04/2014   Lab Results  Component Value Date   CHOL 154 12/03/2012   Lab Results  Component Value Date   HDL 59 12/03/2012   Lab Results  Component Value Date   LDLCALC 78 12/03/2012   Lab Results  Component Value Date   TRIG 87 12/03/2012   Lab Results  Component Value Date   CHOLHDL 2.6 12/03/2012   No results found for: HGBA1C    Assessment & Plan:   Problem List Items Addressed This Visit      Other   Polyarthralgia - Primary    I do think it is worth doing some additional work-up for autoimmune type arthritis.  We will get labs today and call with results once available it is a little bit unusual that she is already had a low back and cervical spine surgery.      Relevant Orders   Sedimentation rate   VITAMIN D 25 Hydroxy (Vit-D Deficiency, Fractures)   C-reactive protein   ANA   Rheumatoid factor   Uric acid   Cyclic citrul peptide antibody, IgG   Attention deficit disorder (ADD)    She is actually doing really well on the increased regimen.  She is also completely asymptomatic blood pressure is well controlled she is not tachycardic she is not having any other symptoms.  We will continue current regimen and and see her back in 3 months to make sure that she is doing well.      Relevant Medications   lisdexamfetamine (VYVANSE) 70 MG capsule (Start on 02/18/2020)   lisdexamfetamine (VYVANSE) 70 MG capsule (Start on 01/20/2020)   lisdexamfetamine (VYVANSE) 70 MG capsule      Meds ordered  this encounter  Medications  . lisdexamfetamine (VYVANSE) 70 MG capsule    Sig: Take 1 capsule (70 mg total) by mouth daily.    Dispense:  30 capsule    Refill:  0  . lisdexamfetamine (VYVANSE) 70 MG capsule    Sig: Take 1 capsule (70 mg total) by mouth every morning.    Dispense:  30 capsule    Refill:  0  . lisdexamfetamine (VYVANSE) 70 MG capsule    Sig: Take 1 capsule (70 mg total) by mouth daily.    Dispense:  30 capsule    Refill:  0    Follow-up: No follow-ups on file.    Nani Gasser, MD

## 2019-12-24 LAB — URIC ACID: Uric Acid, Serum: 2.8 mg/dL (ref 2.5–7.0)

## 2019-12-24 LAB — CYCLIC CITRUL PEPTIDE ANTIBODY, IGG: Cyclic Citrullin Peptide Ab: <16 UNITS

## 2019-12-24 LAB — ANA: Anti Nuclear Antibody (ANA): NEGATIVE

## 2019-12-24 LAB — SEDIMENTATION RATE: Sed Rate: 9 mm/h (ref 0–20)

## 2019-12-24 LAB — C-REACTIVE PROTEIN: CRP: 4.2 mg/L (ref <8.0)

## 2019-12-24 LAB — RHEUMATOID FACTOR: Rheumatoid fact SerPl-aCnc: <14 IU/mL (ref <14)

## 2020-01-21 ENCOUNTER — Other Ambulatory Visit: Payer: Self-pay | Admitting: Family Medicine

## 2020-03-23 ENCOUNTER — Ambulatory Visit (INDEPENDENT_AMBULATORY_CARE_PROVIDER_SITE_OTHER): Payer: Managed Care, Other (non HMO) | Admitting: Family Medicine

## 2020-03-23 ENCOUNTER — Encounter: Payer: Self-pay | Admitting: Family Medicine

## 2020-03-23 ENCOUNTER — Other Ambulatory Visit: Payer: Self-pay

## 2020-03-23 VITALS — BP 123/84 | HR 97 | Ht 66.0 in | Wt 157.0 lb

## 2020-03-23 DIAGNOSIS — Z13220 Encounter for screening for lipoid disorders: Secondary | ICD-10-CM | POA: Diagnosis not present

## 2020-03-23 DIAGNOSIS — F988 Other specified behavioral and emotional disorders with onset usually occurring in childhood and adolescence: Secondary | ICD-10-CM | POA: Diagnosis not present

## 2020-03-23 MED ORDER — LISDEXAMFETAMINE DIMESYLATE 70 MG PO CAPS: 70.0000 mg | ORAL_CAPSULE | Freq: Every day | ORAL | 0 refills | Status: DC

## 2020-03-23 MED ORDER — LISDEXAMFETAMINE DIMESYLATE 70 MG PO CAPS: 70.0000 mg | ORAL_CAPSULE | ORAL | 0 refills | Status: DC

## 2020-03-23 NOTE — Assessment & Plan Note (Signed)
Well controlled. Continue current regimen. Follow up in  4 mo. supply sent to pharmacy.

## 2020-03-23 NOTE — Progress Notes (Signed)
Established Patient Office Visit  Subjective:  Patient ID: Monica Santiago, female    DOB: 1975-03-10  Age: 45 y.o. MRN: [id number]  CC:  Chief Complaint  Patient presents with  . ADD     Illene Bolus presents for  ADHD - Reports symptoms are well controlled on current regime. Denies any problems with insomnia, chest pain, palpitations, or SOB.  Feel like this like her mood is actually also been stable less irritability etc.  She is happy with her regimen she has not noticed any change in her sleep quality.  He did let me know that the orthopedist has recommended that she work light duty so she will actually be working from home doing prior authorizations.  She will start that I believe next month.   Past Medical History:  Diagnosis Date  . ADD (attention deficit disorder)   . Recurrent UTI    Dr Archer Asa    Past Surgical History:  Procedure Laterality Date  . APPENDECTOMY  2005  . endometrial polyp removed  2009  . LTCS  2004  . LUMBAR MICRODISCECTOMY  10/2015   Dr. Alden Hipp at [facility]  . TONSILLECTOMY  1984    Family History  Problem Relation Age of Onset  . Hypertension Mother   . Cancer Mother        bladder and skin  . Hyperlipidemia Mother   . Heart disease Other   . Colon cancer Maternal Grandmother   . Kidney cancer Maternal Grandmother   . Pancreatic cancer Paternal Grandfather   . COPD Maternal Grandmother   . Congestive Heart Failure Maternal Grandmother     Social History   Socioeconomic History  . Marital status: Married    Spouse name: [name]  . Number of children: 1  . Years of education: Not on file  . Highest education level: Not on file  Occupational History  . 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   Tobacco Use  . Smoking status: Never Smoker  . Smokeless tobacco: Never Used  Substance and Sexual Activity  . Alcohol use: No  . Drug use: Not on file  .  Sexual activity: Yes    Partners: Female  Other Topics Concern  . Not on file  Social History Narrative   Some regular exercise.    Social Determinants of Health   Financial Resource Strain: Not on file  Food Insecurity: Not on file  Transportation Needs: Not on file  Physical Activity: Not on file  Stress: Not on file  Social Connections: Not on file  Intimate Partner Violence: Not on file    Outpatient Medications Prior to Visit  Medication Sig Dispense Refill  . SUMAtriptan (IMITREX) 100 MG tablet Take 100 mg by mouth. Take 1 tablet (100 mg total) by mouth once for 1 dose. May repeat in 2 hours if headache persists or recurs.    . TRI-SPRINTEC 0.18/0.215/0.25 MG-35 MCG tablet TAKE 1 TABLET BY MOUTH DAILY 84 tablet 4  . lisdexamfetamine (VYVANSE) 70 MG capsule Take 1 capsule (70 mg total) by mouth daily. 30 capsule 0  . lisdexamfetamine (VYVANSE) 70 MG capsule Take 1 capsule (70 mg total) by mouth every morning. 30 capsule 0  . lisdexamfetamine (VYVANSE) 70 MG capsule Take 1 capsule (70 mg total) by mouth daily. 30 capsule 0   No facility-administered medications prior to visit.    Allergies  Allergen Reactions  . Hydrocodone-Acetaminophen Nausea And Vomiting  ROS Review of Systems    Objective:    Physical Exam Constitutional:      Appearance: She is well-developed and well-nourished.  HENT:     Head: Normocephalic and atraumatic.  Cardiovascular:     Rate and Rhythm: Normal rate and regular rhythm.     Heart sounds: Normal heart sounds.  Pulmonary:     Effort: Pulmonary effort is normal.     Breath sounds: Normal breath sounds.  Skin:    General: Skin is warm and dry.  Neurological:     Mental Status: She is alert and oriented to person, place, and time.  Psychiatric:        Mood and Affect: Mood and affect normal.        Behavior: Behavior normal.     BP 123/84   Pulse 97   Ht 5\' 6"  (1.676 m)   Wt 157 lb (71.2 kg)   SpO2 100%   BMI 25.34 kg/m   Wt Readings from Last 3 Encounters:  03/23/20 157 lb (71.2 kg)  12/21/19 156 lb (70.8 kg)  11/25/19 157 lb (71.2 kg)     Health Maintenance Due  Topic Date Due  . Hepatitis C Screening  Never done  . COLONOSCOPY (Pts 45-70yrs Insurance coverage will need to be confirmed)  Never done    There are no preventive care reminders to display for this patient.  Lab Results  Component Value Date   TSH 1.122 11/04/2014   Lab Results  Component Value Date   WBC 6.2 11/04/2014   HGB 14.1 11/04/2014   HCT 41.5 11/04/2014   MCV 91.4 11/04/2014   PLT 216 11/04/2014   Lab Results  Component Value Date   NA 139 11/04/2014   K 3.7 11/04/2014   CO2 27 11/04/2014   GLUCOSE 73 11/04/2014   BUN 11 11/04/2014   CREATININE 0.62 11/04/2014   BILITOT 0.6 11/04/2014   ALKPHOS 33 11/04/2014   AST 16 11/04/2014   ALT 15 11/04/2014   PROT 6.5 11/04/2014   ALBUMIN 3.9 11/04/2014   CALCIUM 9.2 11/04/2014   Lab Results  Component Value Date   CHOL 154 12/03/2012   Lab Results  Component Value Date   HDL 59 12/03/2012   Lab Results  Component Value Date   LDLCALC 78 12/03/2012   Lab Results  Component Value Date   TRIG 87 12/03/2012   Lab Results  Component Value Date   CHOLHDL 2.6 12/03/2012   No results found for: HGBA1C    Assessment & Plan:   Problem List Items Addressed This Visit      Other   Screening, lipid - Primary   Relevant Orders   Lipid Panel w/reflex Direct LDL   COMPLETE METABOLIC PANEL WITH GFR   Attention deficit disorder (ADD)    Well controlled. Continue current regimen. Follow up in  4 mo. supply sent to pharmacy.      Relevant Medications   lisdexamfetamine (VYVANSE) 70 MG capsule (Start on 05/19/2020)   lisdexamfetamine (VYVANSE) 70 MG capsule (Start on 04/22/2020)   lisdexamfetamine (VYVANSE) 70 MG capsule      Meds ordered this encounter  Medications  . lisdexamfetamine (VYVANSE) 70 MG capsule    Sig: Take 1 capsule (70 mg total) by  mouth daily.    Dispense:  30 capsule    Refill:  0  . lisdexamfetamine (VYVANSE) 70 MG capsule    Sig: Take 1 capsule (70 mg total) by mouth every morning.  Dispense:  30 capsule    Refill:  0  . lisdexamfetamine (VYVANSE) 70 MG capsule    Sig: Take 1 capsule (70 mg total) by mouth daily.    Dispense:  30 capsule    Refill:  0    Follow-up: Return in about 4 months (around 07/21/2020) for ADHD.    Nani Gasser, MD

## 2020-07-05 ENCOUNTER — Encounter: Payer: Self-pay | Admitting: Family Medicine

## 2020-07-05 DIAGNOSIS — F988 Other specified behavioral and emotional disorders with onset usually occurring in childhood and adolescence: Secondary | ICD-10-CM

## 2020-07-06 MED ORDER — LISDEXAMFETAMINE DIMESYLATE 70 MG PO CAPS: 70.0000 mg | ORAL_CAPSULE | Freq: Every day | ORAL | 0 refills | Status: DC

## 2020-07-06 NOTE — Telephone Encounter (Signed)
sent 

## 2020-07-21 ENCOUNTER — Encounter: Payer: Self-pay | Admitting: Family Medicine

## 2020-07-21 ENCOUNTER — Ambulatory Visit: Payer: Managed Care, Other (non HMO) | Admitting: Family Medicine

## 2020-07-21 ENCOUNTER — Other Ambulatory Visit: Payer: Self-pay

## 2020-07-21 VITALS — BP 118/73 | HR 84 | Ht 66.0 in | Wt 158.0 lb

## 2020-07-21 DIAGNOSIS — W57XXXA Bitten or stung by nonvenomous insect and other nonvenomous arthropods, initial encounter: Secondary | ICD-10-CM

## 2020-07-21 DIAGNOSIS — F988 Other specified behavioral and emotional disorders with onset usually occurring in childhood and adolescence: Secondary | ICD-10-CM

## 2020-07-21 DIAGNOSIS — S80862A Insect bite (nonvenomous), left lower leg, initial encounter: Secondary | ICD-10-CM | POA: Diagnosis not present

## 2020-07-21 MED ORDER — LISDEXAMFETAMINE DIMESYLATE 70 MG PO CAPS: 70.0000 mg | ORAL_CAPSULE | Freq: Every day | ORAL | 0 refills | Status: DC

## 2020-07-21 MED ORDER — LISDEXAMFETAMINE DIMESYLATE 70 MG PO CAPS: 70.0000 mg | ORAL_CAPSULE | ORAL | 0 refills | Status: DC

## 2020-07-21 NOTE — Assessment & Plan Note (Signed)
Well controlled. Continue current regimen. Follow up in  6 mo  

## 2020-07-21 NOTE — Progress Notes (Signed)
Established Patient Office Visit  Subjective:  Patient ID: Monica Santiago, female    DOB: March 23, 1975  Age: 45 y.o. MRN: [id number]  CC:  Chief Complaint  Patient presents with  . ADD    HPI Monica Santiago presents for s  ADHD - Reports symptoms are well controlled on current regime. Denies any problems with insomnia, chest pain, palpitations, or SOB.    She found 3 ticks about a week ago.  One of them theh head was still embedded.  She had her husband try to take it out.  She has been trying to keep it clean and use Neosporin/triple antibiotic ointment on the area.  Past Medical History:  Diagnosis Date  . ADD (attention deficit disorder)   . Recurrent UTI    Dr Archer Asa    Past Surgical History:  Procedure Laterality Date  . APPENDECTOMY  2005  . endometrial polyp removed  2009  . LTCS  2004  . LUMBAR MICRODISCECTOMY  10/2015   Dr. Alden Hipp at [place]  . TONSILLECTOMY  1984    Family History  Problem Relation Age of Onset  . Hypertension Mother   . Cancer Mother        bladder and skin  . Hyperlipidemia Mother   . Heart disease Other   . Colon cancer Maternal Grandmother   . Kidney cancer Maternal Grandmother   . Pancreatic cancer Paternal Grandfather   . COPD Maternal Grandmother   . Congestive Heart Failure Maternal Grandmother     Social History   Socioeconomic History  . Marital status: Married    Spouse name: [name]  . Number of children: 1  . Years of education: Not on file  . Highest education level: Not on file  Occupational History  . 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   Tobacco Use  . Smoking status: Never Smoker  . Smokeless tobacco: Never Used  Substance and Sexual Activity  . Alcohol use: No  . Drug use: Not on file  . Sexual activity: Yes    Partners: Female  Other Topics Concern  . Not on file  Social History Narrative   Some regular exercise.     Social Determinants of Health   Financial Resource Strain: Not on file  Food Insecurity: Not on file  Transportation Needs: Not on file  Physical Activity: Not on file  Stress: Not on file  Social Connections: Not on file  Intimate Partner Violence: Not on file    Outpatient Medications Prior to Visit  Medication Sig Dispense Refill  . SUMAtriptan (IMITREX) 100 MG tablet Take 100 mg by mouth. Take 1 tablet (100 mg total) by mouth once for 1 dose. May repeat in 2 hours if headache persists or recurs.    . TRI-SPRINTEC 0.18/0.215/0.25 MG-35 MCG tablet TAKE 1 TABLET BY MOUTH DAILY 84 tablet 4  . lisdexamfetamine (VYVANSE) 70 MG capsule Take 1 capsule (70 mg total) by mouth every morning. 30 capsule 0  . lisdexamfetamine (VYVANSE) 70 MG capsule Take 1 capsule (70 mg total) by mouth daily. 30 capsule 0  . lisdexamfetamine (VYVANSE) 70 MG capsule Take 1 capsule (70 mg total) by mouth daily. 30 capsule 0   No facility-administered medications prior to visit.    Allergies  Allergen Reactions  . Hydrocodone-Acetaminophen Nausea And Vomiting    ROS Review of Systems    Objective:    Physical Exam Constitutional:  Appearance: She is well-developed.  HENT:     Head: Normocephalic and atraumatic.  Cardiovascular:     Rate and Rhythm: Normal rate and regular rhythm.     Heart sounds: Normal heart sounds.  Pulmonary:     Effort: Pulmonary effort is normal.     Breath sounds: Normal breath sounds.  Skin:    General: Skin is warm and dry.  Neurological:     Mental Status: She is alert and oriented to person, place, and time.  Psychiatric:        Behavior: Behavior normal.     BP 118/73   Pulse 84   Ht 5\' 6"  (1.676 m)   Wt 158 lb (71.7 kg)   SpO2 100%   BMI 25.50 kg/m  Wt Readings from Last 3 Encounters:  07/21/20 158 lb (71.7 kg)  03/23/20 157 lb (71.2 kg)  12/21/19 156 lb (70.8 kg)     Health Maintenance Due  Topic Date Due  . Hepatitis C Screening  Never  done  . COLONOSCOPY (Pts 45-37yrs Insurance coverage will need to be confirmed)  Never done    There are no preventive care reminders to display for this patient.  Lab Results  Component Value Date   TSH 1.122 11/04/2014   Lab Results  Component Value Date   WBC 6.2 11/04/2014   HGB 14.1 11/04/2014   HCT 41.5 11/04/2014   MCV 91.4 11/04/2014   PLT 216 11/04/2014   Lab Results  Component Value Date   NA 139 11/04/2014   K 3.7 11/04/2014   CO2 27 11/04/2014   GLUCOSE 73 11/04/2014   BUN 11 11/04/2014   CREATININE 0.62 11/04/2014   BILITOT 0.6 11/04/2014   ALKPHOS 33 11/04/2014   AST 16 11/04/2014   ALT 15 11/04/2014   PROT 6.5 11/04/2014   ALBUMIN 3.9 11/04/2014   CALCIUM 9.2 11/04/2014   Lab Results  Component Value Date   CHOL 154 12/03/2012   Lab Results  Component Value Date   HDL 59 12/03/2012   Lab Results  Component Value Date   LDLCALC 78 12/03/2012   Lab Results  Component Value Date   TRIG 87 12/03/2012   Lab Results  Component Value Date   CHOLHDL 2.6 12/03/2012   No results found for: HGBA1C    Assessment & Plan:   Problem List Items Addressed This Visit      Other   Attention deficit disorder (ADD)    Well controlled. Continue current regimen. Follow up in  6 mo       Relevant Medications   lisdexamfetamine (VYVANSE) 70 MG capsule (Start on 09/18/2020)   lisdexamfetamine (VYVANSE) 70 MG capsule (Start on 08/20/2020)   lisdexamfetamine (VYVANSE) 70 MG capsule    Other Visit Diagnoses    Tick bite of left lower leg, initial encounter    -  Primary     Tick bite-wound looks clean.  No sign of infection or erythema.  Encouraged her to avoid alcohol peroxide and switch to Vaseline instead of Neosporin.   Meds ordered this encounter  Medications  . lisdexamfetamine (VYVANSE) 70 MG capsule    Sig: Take 1 capsule (70 mg total) by mouth every morning.    Dispense:  30 capsule    Refill:  0  . lisdexamfetamine (VYVANSE) 70 MG capsule     Sig: Take 1 capsule (70 mg total) by mouth daily.    Dispense:  30 capsule    Refill:  0  .  lisdexamfetamine (VYVANSE) 70 MG capsule    Sig: Take 1 capsule (70 mg total) by mouth daily.    Dispense:  30 capsule    Refill:  0    Follow-up: Return in about 6 months (around 01/21/2021) for ADHD.    Nani Gasser, MD

## 2020-11-15 ENCOUNTER — Other Ambulatory Visit: Payer: Self-pay | Admitting: Family Medicine

## 2020-11-15 DIAGNOSIS — F988 Other specified behavioral and emotional disorders with onset usually occurring in childhood and adolescence: Secondary | ICD-10-CM

## 2020-11-15 MED ORDER — LISDEXAMFETAMINE DIMESYLATE 70 MG PO CAPS: 70.0000 mg | ORAL_CAPSULE | ORAL | 0 refills | Status: DC

## 2020-12-15 ENCOUNTER — Other Ambulatory Visit: Payer: Self-pay | Admitting: Family Medicine

## 2020-12-15 DIAGNOSIS — F988 Other specified behavioral and emotional disorders with onset usually occurring in childhood and adolescence: Secondary | ICD-10-CM

## 2020-12-15 MED ORDER — LISDEXAMFETAMINE DIMESYLATE 70 MG PO CAPS
70.0000 mg | ORAL_CAPSULE | ORAL | 0 refills | Status: DC
Start: 2020-12-15 — End: 2021-01-22

## 2021-01-22 ENCOUNTER — Other Ambulatory Visit: Payer: Self-pay

## 2021-01-22 ENCOUNTER — Ambulatory Visit: Payer: Managed Care, Other (non HMO) | Admitting: Family Medicine

## 2021-01-22 ENCOUNTER — Encounter: Payer: Self-pay | Admitting: Family Medicine

## 2021-01-22 VITALS — BP 127/76 | HR 90 | Ht 66.0 in | Wt 156.0 lb

## 2021-01-22 DIAGNOSIS — M542 Cervicalgia: Secondary | ICD-10-CM | POA: Diagnosis not present

## 2021-01-22 DIAGNOSIS — F988 Other specified behavioral and emotional disorders with onset usually occurring in childhood and adolescence: Secondary | ICD-10-CM | POA: Diagnosis not present

## 2021-01-22 DIAGNOSIS — G8929 Other chronic pain: Secondary | ICD-10-CM

## 2021-01-22 DIAGNOSIS — G43821 Menstrual migraine, not intractable, with status migrainosus: Secondary | ICD-10-CM

## 2021-01-22 MED ORDER — LISDEXAMFETAMINE DIMESYLATE 70 MG PO CAPS: 70.0000 mg | ORAL_CAPSULE | Freq: Every day | ORAL | 0 refills | Status: DC

## 2021-01-22 MED ORDER — NORGESTIM-ETH ESTRAD TRIPHASIC 0.18/0.215/0.25 MG-35 MCG PO TABS: 1.0000 | ORAL_TABLET | Freq: Every day | ORAL | 4 refills | Status: DC

## 2021-01-22 MED ORDER — LISDEXAMFETAMINE DIMESYLATE 70 MG PO CAPS: 70.0000 mg | ORAL_CAPSULE | ORAL | 0 refills | Status: DC

## 2021-01-22 MED ORDER — SUMATRIPTAN SUCCINATE 100 MG PO TABS: 100.0000 mg | ORAL_TABLET | Freq: Every day | ORAL | 6 refills | Status: DC | PRN

## 2021-01-22 MED ORDER — METAXALONE 400 MG PO TABS: 400.0000 mg | ORAL_TABLET | Freq: Three times a day (TID) | ORAL | 3 refills | Status: DC

## 2021-01-22 NOTE — Assessment & Plan Note (Signed)
Well controlled. Continue current regimen. Follow up in  6 mo  

## 2021-01-22 NOTE — Assessment & Plan Note (Signed)
Discussed refilling her  Imitrex. Can try another tryptan if needed.

## 2021-01-22 NOTE — Progress Notes (Signed)
Established Patient Office Visit  Subjective:  Patient ID: Monica Santiago, female    DOB: 25-Apr-1975  Age: 45 y.o. MRN: [id number]  CC: No chief complaint on file.   HPI [name] presents for 6 mo f/u   ADD - Reports symptoms are well controlled on current regime. Denies any problems with insomnia, chest pain, palpitations, or SOB.    F/U migraine headaches-overall she feels like she is doing okay she has been getting a lot of headaches triggered by her neck she had cervical surgery and has multiple fusions in her cervical spine so she is very limited in her range of motion and so she gets a lot of knots in the neck muscles which then causes pain at the base of her head which then triggers a headache.  She says sometimes it will just happen the middle of the night and then she wakes up with a headache and knows it will be there the rest of the day she will usually use ibuprofen which decreases the intensity of the pain but it does not really relieve it.  She is tried heat and ice.  She is done her stretches and exercises that she learned in physical therapy after her surgery.  She wondered if massage would be okay to do or even acupuncture.   Past Medical History:  Diagnosis Date   ADD (attention deficit disorder)    Recurrent UTI    Dr Archer Asa    Past Surgical History:  Procedure Laterality Date   APPENDECTOMY  2005   endometrial polyp removed  2009   LTCS  2004   LUMBAR MICRODISCECTOMY  10/2015   Dr. Alden Hipp at [place]   TONSILLECTOMY  1984    Family History  Problem Relation Age of Onset   Hypertension Mother    Cancer Mother        bladder and skin   Hyperlipidemia Mother    Heart disease Other    Colon cancer Maternal Grandmother    Kidney cancer Maternal Grandmother    Pancreatic cancer Paternal Grandfather    COPD Maternal Grandmother    Congestive Heart Failure Maternal Grandmother     Social History   Socioeconomic History   Marital  status: Married    Spouse name: [name]   Number of children: 1   Years of education: Not on file   Highest education level: Not on file  Occupational History   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   Tobacco Use   Smoking status: Never   Smokeless tobacco: Never  Substance and Sexual Activity   Alcohol use: No   Drug use: Not on file   Sexual activity: Yes    Partners: Female  Other Topics Concern   Not on file  Social History Narrative   Some regular exercise.    Social Determinants of Health   Financial Resource Strain: Not on file  Food Insecurity: Not on file  Transportation Needs: Not on file  Physical Activity: Not on file  Stress: Not on file  Social Connections: Not on file  Intimate Partner Violence: Not on file    Outpatient Medications Prior to Visit  Medication Sig Dispense Refill   lisdexamfetamine (VYVANSE) 70 MG capsule Take 1 capsule (70 mg total) by mouth daily. 30 capsule 0   lisdexamfetamine (VYVANSE) 70 MG capsule Take 1 capsule (70 mg total) by mouth daily. 30 capsule 0   lisdexamfetamine (  VYVANSE) 70 MG capsule Take 1 capsule (70 mg total) by mouth every morning. 30 capsule 0   SUMAtriptan (IMITREX) 100 MG tablet Take 100 mg by mouth. Take 1 tablet (100 mg total) by mouth once for 1 dose. May repeat in 2 hours if headache persists or recurs.     TRI-SPRINTEC 0.18/0.215/0.25 MG-35 MCG tablet TAKE 1 TABLET BY MOUTH DAILY 84 tablet 4   No facility-administered medications prior to visit.    Allergies  Allergen Reactions   Hydrocodone-Acetaminophen Nausea And Vomiting    ROS Review of Systems    Objective:    Physical Exam Constitutional:      Appearance: Normal appearance. She is well-developed.  HENT:     Head: Normocephalic and atraumatic.  Cardiovascular:     Rate and Rhythm: Normal rate and regular rhythm.     Heart sounds: Normal heart sounds.  Pulmonary:     Effort:  Pulmonary effort is normal.     Breath sounds: Normal breath sounds.  Skin:    General: Skin is warm and dry.  Neurological:     Mental Status: She is alert and oriented to person, place, and time.  Psychiatric:        Behavior: Behavior normal.    BP 127/76   Pulse 90   Ht 5\' 6"  (1.676 m)   Wt 156 lb (70.8 kg)   SpO2 100%   BMI 25.18 kg/m  Wt Readings from Last 3 Encounters:  01/22/21 156 lb (70.8 kg)  07/21/20 158 lb (71.7 kg)  03/23/20 157 lb (71.2 kg)     Health Maintenance Due  Topic Date Due   Hepatitis C Screening  Never done   COLONOSCOPY (Pts 45-55yrs Insurance coverage will need to be confirmed)  Never done   COVID-19 Vaccine (4 - Booster for Pfizer series) 02/10/2020    There are no preventive care reminders to display for this patient.  Lab Results  Component Value Date   TSH 1.122 11/04/2014   Lab Results  Component Value Date   WBC 6.2 11/04/2014   HGB 14.1 11/04/2014   HCT 41.5 11/04/2014   MCV 91.4 11/04/2014   PLT 216 11/04/2014   Lab Results  Component Value Date   NA 139 11/04/2014   K 3.7 11/04/2014   CO2 27 11/04/2014   GLUCOSE 73 11/04/2014   BUN 11 11/04/2014   CREATININE 0.62 11/04/2014   BILITOT 0.6 11/04/2014   ALKPHOS 33 11/04/2014   AST 16 11/04/2014   ALT 15 11/04/2014   PROT 6.5 11/04/2014   ALBUMIN 3.9 11/04/2014   CALCIUM 9.2 11/04/2014   Lab Results  Component Value Date   CHOL 154 12/03/2012   Lab Results  Component Value Date   HDL 59 12/03/2012   Lab Results  Component Value Date   LDLCALC 78 12/03/2012   Lab Results  Component Value Date   TRIG 87 12/03/2012   Lab Results  Component Value Date   CHOLHDL 2.6 12/03/2012   No results found for: HGBA1C    Assessment & Plan:   Problem List Items Addressed This Visit       Cardiovascular and Mediastinum   Menstrual migraine with status migrainosus, not intractable    Discussed refilling her  Imitrex. Can try another tryptan if needed.         Relevant Medications   SUMAtriptan (IMITREX) 100 MG tablet   metaxalone (SKELAXIN) 400 MG tablet     Other   Attention deficit disorder (ADD) -  Primary    Well controlled. Continue current regimen. Follow up in  71mo       Relevant Medications   lisdexamfetamine (VYVANSE) 70 MG capsule (Start on 03/22/2021)   lisdexamfetamine (VYVANSE) 70 MG capsule (Start on 02/20/2021)   lisdexamfetamine (VYVANSE) 70 MG capsule   Other Visit Diagnoses     Neck pain, chronic       Relevant Medications   metaxalone (SKELAXIN) 400 MG tablet       Chronic neck pain-I do think she could have massage therapy done as long as her not doing any type of traction on her neck.  I would definitely avoid chiropractic care.  Okay for acupuncture.  Did send over prescription for a less sedating muscle relaxer to try occasionally to see if it is helpful.  Also send over Imitrex which she has not filled in a couple of years.  Also encouraged her to work on trying to figure out a good sleep position.  Encourage to schedule her mammogram.   Meds ordered this encounter  Medications   lisdexamfetamine (VYVANSE) 70 MG capsule    Sig: Take 1 capsule (70 mg total) by mouth daily.    Dispense:  30 capsule    Refill:  0   lisdexamfetamine (VYVANSE) 70 MG capsule    Sig: Take 1 capsule (70 mg total) by mouth daily.    Dispense:  30 capsule    Refill:  0   lisdexamfetamine (VYVANSE) 70 MG capsule    Sig: Take 1 capsule (70 mg total) by mouth every morning.    Dispense:  30 capsule    Refill:  0   SUMAtriptan (IMITREX) 100 MG tablet    Sig: Take 1 tablet (100 mg total) by mouth daily as needed for migraine. Take 1 tablet (100 mg total) by mouth once for 1 dose. May repeat in 2 hours if headache persists or recurs.    Dispense:  10 tablet    Refill:  6   metaxalone (SKELAXIN) 400 MG tablet    Sig: Take 1 tablet (400 mg total) by mouth 3 (three) times daily.    Dispense:  30 tablet    Refill:  3    Norgestimate-Ethinyl Estradiol Triphasic (TRI-SPRINTEC) 0.18/0.215/0.25 MG-35 MCG tablet    Sig: Take 1 tablet by mouth daily.    Dispense:  84 tablet    Refill:  4     Follow-up: Return in about 6 months (around 07/22/2021) for ADD Meds.    Nani Gasser, MD

## 2021-04-25 ENCOUNTER — Other Ambulatory Visit: Payer: Self-pay | Admitting: Family Medicine

## 2021-04-25 DIAGNOSIS — F988 Other specified behavioral and emotional disorders with onset usually occurring in childhood and adolescence: Secondary | ICD-10-CM

## 2021-04-25 MED ORDER — LISDEXAMFETAMINE DIMESYLATE 70 MG PO CAPS: 70.0000 mg | ORAL_CAPSULE | Freq: Every day | ORAL | 0 refills | Status: DC

## 2021-04-25 MED ORDER — LISDEXAMFETAMINE DIMESYLATE 70 MG PO CAPS: 70.0000 mg | ORAL_CAPSULE | ORAL | 0 refills | Status: DC

## 2021-07-02 ENCOUNTER — Other Ambulatory Visit: Payer: Self-pay | Admitting: Family Medicine

## 2021-07-02 DIAGNOSIS — F988 Other specified behavioral and emotional disorders with onset usually occurring in childhood and adolescence: Secondary | ICD-10-CM

## 2021-07-03 MED ORDER — LISDEXAMFETAMINE DIMESYLATE 70 MG PO CAPS: 70.0000 mg | ORAL_CAPSULE | Freq: Every day | ORAL | 0 refills | Status: DC

## 2021-07-23 ENCOUNTER — Encounter: Payer: Self-pay | Admitting: Family Medicine

## 2021-07-23 ENCOUNTER — Ambulatory Visit: Payer: Managed Care, Other (non HMO) | Admitting: Family Medicine

## 2021-07-23 VITALS — BP 125/95 | HR 127 | Ht 66.0 in | Wt 136.0 lb

## 2021-07-23 DIAGNOSIS — G43001 Migraine without aura, not intractable, with status migrainosus: Secondary | ICD-10-CM

## 2021-07-23 DIAGNOSIS — F988 Other specified behavioral and emotional disorders with onset usually occurring in childhood and adolescence: Secondary | ICD-10-CM | POA: Diagnosis not present

## 2021-07-23 MED ORDER — LISDEXAMFETAMINE DIMESYLATE 70 MG PO CAPS: 70.0000 mg | ORAL_CAPSULE | ORAL | 0 refills | Status: DC

## 2021-07-23 MED ORDER — TOPIRAMATE 25 MG PO TABS: ORAL_TABLET | ORAL | 1 refills | Status: DC

## 2021-07-23 MED ORDER — LISDEXAMFETAMINE DIMESYLATE 70 MG PO CAPS: 70.0000 mg | ORAL_CAPSULE | Freq: Every day | ORAL | 0 refills | Status: DC

## 2021-07-23 MED ORDER — RIZATRIPTAN BENZOATE 10 MG PO TBDP: 10.0000 mg | ORAL_TABLET | ORAL | 4 refills | Status: DC | PRN

## 2021-07-23 NOTE — Assessment & Plan Note (Signed)
We discussed several options.  She is currently having 10-12 headaches per month which is quite excessive.  So we discussed switching her rescue medication to something that is little bit more effective and putting her on prophylaxis.  We will start with topiramate 25 mg and taper up as tolerated.  We will switch the Imitrex to Maxalt melts especially since she gets significant nausea and vomiting with her migraines and see if that is helpful hopefully over the next couple of weeks she will start to get some relief if not we could always do a prednisone taper if needed.  Plan to follow-up in about 8 weeks.

## 2021-07-23 NOTE — Progress Notes (Signed)
Established Patient Office Visit  Subjective   Patient ID: Monica Santiago, female    DOB: 04-11-1975  Age: 46 y.o. MRN: [id number]  Chief Complaint  Patient presents with   Follow-up   ADHD   Migraine    HPI   ADD - Reports symptoms are well controlled on current regime. Denies any problems with insomnia, chest pain, palpitations, or SOB.    F/U Migraines -she said she got really sick several months ago and it lasted for several months.  Where she did not feel well she had a persistent cough she lost a significant amount of weight.  She says she tested negative for COVID multiple times even though she had several people around her who had actually tested positive.  But she is convinced she actually had COVID.  She said it really flared her migraines she has not been having 10 to 12/month.  She has been getting vomiting with.  She says is often times on the left side of her head and feels like an intense stabbing pain.  Weather change is a big trigger for her.  In fact today she has a headache.    ROS    Objective:     BP (!) 125/95 (BP Location: Right Arm)   Pulse (!) 127   Ht 5\' 6"  (1.676 m)   Wt 136 lb (61.7 kg)   SpO2 98%   BMI 21.95 kg/m    Physical Exam Vitals and nursing note reviewed.  Constitutional:      Appearance: She is well-developed.  HENT:     Head: Normocephalic and atraumatic.  Cardiovascular:     Rate and Rhythm: Normal rate and regular rhythm.     Heart sounds: Normal heart sounds.  Pulmonary:     Effort: Pulmonary effort is normal.     Breath sounds: Normal breath sounds.  Skin:    General: Skin is warm and dry.  Neurological:     Mental Status: She is alert and oriented to person, place, and time.  Psychiatric:        Behavior: Behavior normal.    No results found for any visits on 07/23/21.    The ASCVD Risk score (Arnett DK, et al., 2019) failed to calculate for the following reasons:   Cannot find a previous HDL lab   Cannot  find a previous total cholesterol lab    Assessment & Plan:   Problem List Items Addressed This Visit       Cardiovascular and Mediastinum   Migraine without aura and with status migrainosus, not intractable    We discussed several options.  She is currently having 10-12 headaches per month which is quite excessive.  So we discussed switching her rescue medication to something that is little bit more effective and putting her on prophylaxis.  We will start with topiramate 25 mg and taper up as tolerated.  We will switch the Imitrex to Maxalt melts especially since she gets significant nausea and vomiting with her migraines and see if that is helpful hopefully over the next couple of weeks she will start to get some relief if not we could always do a prednisone taper if needed.  Plan to follow-up in about 8 weeks.       Relevant Medications   rizatriptan (MAXALT-MLT) 10 MG disintegrating tablet   topiramate (TOPAMAX) 25 MG tablet     Other   Attention deficit disorder (ADD) - Primary    Well controlled. Continue current regimen.  Follow up in  6 mo        Relevant Medications   lisdexamfetamine (VYVANSE) 70 MG capsule (Start on 09/20/2021)   lisdexamfetamine (VYVANSE) 70 MG capsule (Start on 08/22/2021)   lisdexamfetamine (VYVANSE) 70 MG capsule    Return in about 2 months (around 09/22/2021) for New start medication.    Nani Gasser, MD

## 2021-07-23 NOTE — Assessment & Plan Note (Signed)
Well controlled. Continue current regimen. Follow up in  6 mo  

## 2021-09-07 ENCOUNTER — Other Ambulatory Visit: Payer: Self-pay | Admitting: Family Medicine

## 2021-09-07 DIAGNOSIS — F988 Other specified behavioral and emotional disorders with onset usually occurring in childhood and adolescence: Secondary | ICD-10-CM

## 2021-09-10 MED ORDER — LISDEXAMFETAMINE DIMESYLATE 70 MG PO CAPS: 70.0000 mg | ORAL_CAPSULE | ORAL | 0 refills | Status: DC

## 2021-09-17 ENCOUNTER — Encounter: Payer: Self-pay | Admitting: Family Medicine

## 2021-09-17 ENCOUNTER — Ambulatory Visit: Payer: Managed Care, Other (non HMO) | Admitting: Family Medicine

## 2021-09-17 VITALS — BP 111/68 | HR 88 | Ht 66.0 in | Wt 135.0 lb

## 2021-09-17 DIAGNOSIS — G43001 Migraine without aura, not intractable, with status migrainosus: Secondary | ICD-10-CM | POA: Diagnosis not present

## 2021-09-17 DIAGNOSIS — F988 Other specified behavioral and emotional disorders with onset usually occurring in childhood and adolescence: Secondary | ICD-10-CM

## 2021-09-17 MED ORDER — TOPIRAMATE 25 MG PO TABS: 25.0000 mg | ORAL_TABLET | Freq: Two times a day (BID) | ORAL | 3 refills | Status: DC

## 2021-09-17 MED ORDER — LISDEXAMFETAMINE DIMESYLATE 70 MG PO CAPS: 70.0000 mg | ORAL_CAPSULE | Freq: Every day | ORAL | 0 refills | Status: DC

## 2021-09-17 MED ORDER — LISDEXAMFETAMINE DIMESYLATE 70 MG PO CAPS: 70.0000 mg | ORAL_CAPSULE | ORAL | 0 refills | Status: DC

## 2021-09-17 NOTE — Assessment & Plan Note (Signed)
She has had such a great response to Topamax.  She is currently taking 25 mg twice a day.  Refill sent to pharmacy.  We discussed how long to continue the medication.  Her migraines really started triggering after her neck surgery so it is likely related.  She is also doing great with the rescue medication Maxalt.  Follow-up in 6 to 12 months

## 2021-09-17 NOTE — Progress Notes (Signed)
Established Patient Office Visit  Subjective   Patient ID: Monica Santiago, female    DOB: [birth year]  Age: 46 y.o. MRN: [id number]  Chief Complaint  Patient presents with   Migraine    New medication start. Pt was switched from Imitrex to Maxalt and Topamax on 07/23/21    HPI  F/U Migraines -since starting the Topamax she says within about 3 weeks she got significant relief with her migraines.  She was getting 10 to 12 per month previously.  He has been a dramatic change.  Since the medication became effective she is actually only had 2 migraines.  She did try the Maxalt instead of the Imitrex which she was using previously and felt like it worked really well.  She said she got relief within about 20 minutes and was able to continue to function without experiencing any symptoms such as nausea etc.  She has had some numbness and tingling intermittently in the fingers but is not sure if it is really from the Topamax as she was getting that previously after her neck surgery it has not really been any different.  Follow-up ADD-still having difficulty getting her medications filled at the pharmacy.  Last month we sent 3 prescriptions and they only filled one of them and then said they could not find the other 2 we then called to verify my so they did not have them on file though our computer system is showing that the messages made it through.  We will go ahead and send over a new medication for August and September so hopefully these will be able to be filled in a timely manner for her.    ROS    Objective:     BP 111/68   Pulse 88   Ht 5\' 6"  (1.676 m)   Wt 135 lb (61.2 kg)   SpO2 100%   BMI 21.79 kg/m    Physical Exam Vitals and nursing note reviewed.  Constitutional:      Appearance: She is well-developed.  HENT:     Head: Normocephalic and atraumatic.  Cardiovascular:     Rate and Rhythm: Normal rate and regular rhythm.     Heart sounds: Normal heart sounds.  Pulmonary:      Effort: Pulmonary effort is normal.     Breath sounds: Normal breath sounds.  Skin:    General: Skin is warm and dry.  Neurological:     Mental Status: She is alert and oriented to person, place, and time.  Psychiatric:        Behavior: Behavior normal.      No results found for any visits on 09/17/21.    The ASCVD Risk score (Arnett DK, et al., 2019) failed to calculate for the following reasons:   Cannot find a previous HDL lab   Cannot find a previous total cholesterol lab    Assessment & Plan:   Problem List Items Addressed This Visit       Cardiovascular and Mediastinum   Migraine without aura and with status migrainosus, not intractable - Primary    She has had such a great response to Topamax.  She is currently taking 25 mg twice a day.  Refill sent to pharmacy.  We discussed how long to continue the medication.  Her migraines really started triggering after her neck surgery so it is likely related.  She is also doing great with the rescue medication Maxalt.  Follow-up in 6 to 12 months  Relevant Medications   topiramate (TOPAMAX) 25 MG tablet     Other   Attention deficit disorder (ADD)    She is picking up the prescription for July so I did go ahead and send the prescription for August and September.  Follow-up in 6 months.      Relevant Medications   lisdexamfetamine (VYVANSE) 70 MG capsule (Start on 11/19/2021)   lisdexamfetamine (VYVANSE) 70 MG capsule (Start on 10/20/2021)    Return in about 6 months (around 03/20/2022) for ADD medication and migraines.    Nani Gasser, MD

## 2021-09-17 NOTE — Assessment & Plan Note (Addendum)
She is picking up the prescription for July so I did go ahead and send the prescription for August and September.  Follow-up in 6 months.

## 2021-10-06 ENCOUNTER — Other Ambulatory Visit: Payer: Self-pay | Admitting: Family Medicine

## 2021-10-16 ENCOUNTER — Other Ambulatory Visit: Payer: Self-pay | Admitting: Family Medicine

## 2021-10-16 DIAGNOSIS — F988 Other specified behavioral and emotional disorders with onset usually occurring in childhood and adolescence: Secondary | ICD-10-CM

## 2021-10-17 NOTE — Telephone Encounter (Signed)
Please call pharmacy: They need to fill the prescription that stated July 20.

## 2021-10-17 NOTE — Telephone Encounter (Signed)
Per [facility]/patient - "I called [facility] in [place] for my refill on my Vyvanse and was told that my RX stated that I was not able to fill it until 8/19 when my last prescription was filled on 7/10? So I have been out of my medication for a couple of days and the pharmacy states the date would need to be changed on the prescription they have on file. Sorry for the inconvenience".

## 2021-10-20 ENCOUNTER — Encounter: Payer: Self-pay | Admitting: Family Medicine

## 2021-10-22 ENCOUNTER — Encounter: Payer: Self-pay | Admitting: Family Medicine

## 2021-10-22 DIAGNOSIS — F988 Other specified behavioral and emotional disorders with onset usually occurring in childhood and adolescence: Secondary | ICD-10-CM

## 2021-10-23 MED ORDER — METHYLPHENIDATE HCL ER (OSM) 54 MG PO TBCR: 54.0000 mg | EXTENDED_RELEASE_TABLET | ORAL | 0 refills | Status: DC

## 2021-11-12 ENCOUNTER — Telehealth: Payer: Self-pay

## 2021-11-12 NOTE — Telephone Encounter (Addendum)
Initiated Prior authorization TAE:WYBRKVTXLE 400MG tablets Via: Covermymeds Case/Key:[id number] Status: denied   as of 11/12/21 Reason:This request has not been approved. Based on the information submitted for review, you did not meet our guideline rules for the requested drug. In order for your request to be approved, your provider would need to show that you have met the guideline rules below. The details below are written in medical language. If you have questions, please contact your provider. In some cases, the requested medication or alternatives offered may have additional approval requirements. For approval of metaxalone 447m tablets, our guideline named [place] that the requested agent is being used for the treatment of one of the following:1) A Food and Drug Administration (FDA)-approved indication.2) A medically accepted indication and it is considered safe and effective by approved compendia (medical references such as Clinical Pharmacology, TCal[employer] CorporationDrugDex, and [place], peer-reviewed medical literature, or accepted standards of medical practice.Your provider requested metaxalone 401mtablets for the treatment of menstrual migraine (headaches that cause intense, throbbing pain on one side or both sides of the head, while on your period), but it is not clinically supported for this use. It is indicated for the treatment of muscle spasms (cramps).This request was denied because your condition does not meet the requirements for approval. Please note that additional guideline requirements apply for patients who have the condition specified above Notified Pt via: Mychart

## 2021-11-23 NOTE — Telephone Encounter (Signed)
ok 

## 2021-11-24 ENCOUNTER — Encounter: Payer: Self-pay | Admitting: Family Medicine

## 2021-11-24 DIAGNOSIS — F988 Other specified behavioral and emotional disorders with onset usually occurring in childhood and adolescence: Secondary | ICD-10-CM

## 2021-11-26 MED ORDER — METHYLPHENIDATE HCL ER (OSM) 54 MG PO TBCR: 54.0000 mg | EXTENDED_RELEASE_TABLET | ORAL | 0 refills | Status: DC

## 2022-03-02 ENCOUNTER — Encounter: Payer: Self-pay | Admitting: Family Medicine

## 2022-03-02 DIAGNOSIS — F988 Other specified behavioral and emotional disorders with onset usually occurring in childhood and adolescence: Secondary | ICD-10-CM

## 2022-03-05 MED ORDER — METHYLPHENIDATE HCL ER (OSM) 54 MG PO TBCR: 54.0000 mg | EXTENDED_RELEASE_TABLET | ORAL | 0 refills | Status: DC

## 2022-03-05 NOTE — Telephone Encounter (Signed)
Rx pended.  Last OV: 09/17/21 Next OV: 03/12/22 Last RF: 01/24/22

## 2022-03-12 ENCOUNTER — Ambulatory Visit: Payer: Managed Care, Other (non HMO) | Admitting: Family Medicine

## 2022-03-12 ENCOUNTER — Encounter: Payer: Self-pay | Admitting: Family Medicine

## 2022-03-12 VITALS — BP 98/67 | HR 79 | Ht 66.0 in | Wt 125.0 lb

## 2022-03-12 DIAGNOSIS — Z1231 Encounter for screening mammogram for malignant neoplasm of breast: Secondary | ICD-10-CM | POA: Diagnosis not present

## 2022-03-12 DIAGNOSIS — Z3041 Encounter for surveillance of contraceptive pills: Secondary | ICD-10-CM

## 2022-03-12 DIAGNOSIS — G43001 Migraine without aura, not intractable, with status migrainosus: Secondary | ICD-10-CM | POA: Diagnosis not present

## 2022-03-12 DIAGNOSIS — F988 Other specified behavioral and emotional disorders with onset usually occurring in childhood and adolescence: Secondary | ICD-10-CM | POA: Diagnosis not present

## 2022-03-12 DIAGNOSIS — Z309 Encounter for contraceptive management, unspecified: Secondary | ICD-10-CM | POA: Insufficient documentation

## 2022-03-12 MED ORDER — METHYLPHENIDATE HCL ER (OSM) 54 MG PO TBCR: 54.0000 mg | EXTENDED_RELEASE_TABLET | ORAL | 0 refills | Status: DC

## 2022-03-12 MED ORDER — NORGESTIM-ETH ESTRAD TRIPHASIC 0.18/0.215/0.25 MG-35 MCG PO TABS: 1.0000 | ORAL_TABLET | Freq: Every day | ORAL | 4 refills | Status: DC

## 2022-03-12 NOTE — Assessment & Plan Note (Signed)
Doing well with current regimen.  Refill sent to pharmacy for 1 year.  Pap smear is up-to-date but mammogram is not just reminded her to schedule she says she plans to do so.

## 2022-03-12 NOTE — Assessment & Plan Note (Signed)
Really well-controlled.  Not currently on prophylaxis doing really well with just as needed Maxalt which she is rarely having to use.

## 2022-03-12 NOTE — Progress Notes (Signed)
Established Patient Office Visit  Subjective   Patient ID: Monica Santiago, female    DOB: [birth year]  Age: 47 y.o. MRN: [id number]  Chief Complaint  Patient presents with   Migraine        ADD    HPI ADD - Reports symptoms are well controlled on current regime. Denies any problems with insomnia, chest pain, palpitations, or SOB.  She has been really happy with the Concerta thus far it is not affected her sleep quality but no chest pain or palpitations.  F/U Migraines  -she is doing really really well infection has not had to use her Maxalt in months.  She says just been migraine free for extended period of time has been really amazing.  She has been exercising more she has been eating really healthy.  Her mother recently got diagnosed with borderline diabetes and so she herself has really checked in her diet.     ROS    Objective:     BP 98/67   Pulse 79   Ht 5\' 6"  (1.676 m)   Wt 125 lb (56.7 kg)   SpO2 100%   BMI 20.18 kg/m    Physical Exam Vitals and nursing note reviewed.  Constitutional:      Appearance: She is well-developed.  HENT:     Head: Normocephalic and atraumatic.  Cardiovascular:     Rate and Rhythm: Normal rate and regular rhythm.     Heart sounds: Normal heart sounds.  Pulmonary:     Effort: Pulmonary effort is normal.     Breath sounds: Normal breath sounds.  Skin:    General: Skin is warm and dry.  Neurological:     Mental Status: She is alert and oriented to person, place, and time.  Psychiatric:        Behavior: Behavior normal.     No results found for any visits on 03/12/22.    The ASCVD Risk score (Arnett DK, et al., 2019) failed to calculate for the following reasons:   Cannot find a previous HDL lab   Cannot find a previous total cholesterol lab    Assessment & Plan:   Problem List Items Addressed This Visit       Cardiovascular and Mediastinum   Migraine without aura and with status migrainosus, not intractable     Really well-controlled.  Not currently on prophylaxis doing really well with just as needed Maxalt which she is rarely having to use.        Other   Contraception management    Doing well with current regimen.  Refill sent to pharmacy for 1 year.  Pap smear is up-to-date but mammogram is not just reminded her to schedule she says she plans to do so.      Relevant Medications   Norgestimate-Ethinyl Estradiol Triphasic (TRI-SPRINTEC) 0.18/0.215/0.25 MG-35 MCG tablet   Attention deficit disorder (ADD) - Primary    Well controlled. Continue current regimen. Follow up in  6 mo        Relevant Medications   methylphenidate (CONCERTA) 54 MG PO CR tablet   methylphenidate (CONCERTA) 54 MG PO CR tablet   methylphenidate (CONCERTA) 54 MG PO CR tablet   Other Visit Diagnoses     Screening mammogram for breast cancer          Reminded to schedule mammogram.  Also encouraged her to schedule CPE so we can get updated labs done hopefully in the spring.  Return in about 6 months (around  09/10/2022) for ADD.    Beatrice Lecher, MD

## 2022-03-12 NOTE — Assessment & Plan Note (Signed)
Well controlled. Continue current regimen. Follow up in  6 mo  

## 2022-03-21 ENCOUNTER — Ambulatory Visit: Payer: Managed Care, Other (non HMO) | Admitting: Family Medicine

## 2022-05-06 ENCOUNTER — Encounter: Payer: Self-pay | Admitting: Family Medicine

## 2022-05-06 ENCOUNTER — Ambulatory Visit (INDEPENDENT_AMBULATORY_CARE_PROVIDER_SITE_OTHER): Payer: Managed Care, Other (non HMO) | Admitting: Family Medicine

## 2022-05-06 VITALS — BP 107/76 | HR 84 | Ht 66.0 in | Wt 124.0 lb

## 2022-05-06 DIAGNOSIS — L723 Sebaceous cyst: Secondary | ICD-10-CM

## 2022-05-06 DIAGNOSIS — Z0000 Encounter for general adult medical examination without abnormal findings: Secondary | ICD-10-CM | POA: Diagnosis not present

## 2022-05-06 DIAGNOSIS — Z1211 Encounter for screening for malignant neoplasm of colon: Secondary | ICD-10-CM | POA: Diagnosis not present

## 2022-05-06 NOTE — Progress Notes (Signed)
Complete physical exam  Patient: Monica Santiago   DOB: 09/12/1975   47 y.o. Female  MRN: [id number]  Subjective:    Chief Complaint  Patient presents with   Annual Exam    KASHARI KRYSINSKI is a 47 y.o. female who presents today for a complete physical exam. She reports consuming a general diet. The patient does not participate in regular exercise at present. She generally feels well.  She does have additional problems to discuss today.   Has noticed a knot on the left labia.  It has been there for about a week and a half it has not really changed.  No drainage.  No discomfort or pain.   Most recent fall risk assessment:    05/06/2022    9:25 AM  Fall Risk   Falls in the past year? 0  Number falls in past yr: 0  Injury with Fall? 0  Risk for fall due to : No Fall Risks  Follow up Falls evaluation completed     Most recent depression screenings:    03/12/2022    9:01 AM 09/17/2021   10:43 AM  PHQ 2/9 Scores  PHQ - 2 Score 0 0        Patient Care Team: Hali Marry, MD as PCP - General (Family Medicine)   Outpatient Medications Prior to Visit  Medication Sig   metaxalone (SKELAXIN) 400 MG tablet Take 1 tablet (400 mg total) by mouth 3 (three) times daily.   methylphenidate (CONCERTA) 54 MG PO CR tablet Take 1 tablet (54 mg total) by mouth every morning.   methylphenidate (CONCERTA) 54 MG PO CR tablet Take 1 tablet (54 mg total) by mouth every morning.   methylphenidate (CONCERTA) 54 MG PO CR tablet Take 1 tablet (54 mg total) by mouth every morning.   Norgestimate-Ethinyl Estradiol Triphasic (TRI-SPRINTEC) 0.18/0.215/0.25 MG-35 MCG tablet Take 1 tablet by mouth daily.   rizatriptan (MAXALT-MLT) 10 MG disintegrating tablet Take 1 tablet (10 mg total) by mouth as needed for migraine. May repeat in 2 hours if needed   topiramate (TOPAMAX) 25 MG tablet TAKE 1 TABLET BY MOUTH AT BEDTIME FOR 3 DAYS, THEN 1 TABLET TWICE DAILY FOR 20 DAYS, THEN 2 TABLETS AT BEDTIME  AND 1 TABLET IN THE MORNING   No facility-administered medications prior to visit.    ROS        Objective:     BP 107/76 (BP Location: Left Arm, Patient Position: Sitting, Cuff Size: Normal)   Pulse 84   Ht '5\' 6"'$  (1.676 m)   Wt 124 lb 0.6 oz (56.3 kg)   SpO2 100%   BMI 20.02 kg/m    Physical Exam Vitals and nursing note reviewed.  Constitutional:      Appearance: She is well-developed.  HENT:     Head: Normocephalic and atraumatic.     Right Ear: External ear normal.     Left Ear: External ear normal.     Nose: Nose normal.  Eyes:     Conjunctiva/sclera: Conjunctivae normal.     Pupils: Pupils are equal, round, and reactive to light.  Neck:     Thyroid: No thyromegaly.  Cardiovascular:     Rate and Rhythm: Normal rate and regular rhythm.     Heart sounds: Normal heart sounds.  Pulmonary:     Effort: Pulmonary effort is normal.     Breath sounds: Normal breath sounds. No wheezing.  Musculoskeletal:     Cervical back: Neck supple.  Lymphadenopathy:     Cervical: No cervical adenopathy.  Skin:    General: Skin is warm and dry.     Coloration: Skin is not pale.  Neurological:     Mental Status: She is alert and oriented to person, place, and time.  Psychiatric:        Behavior: Behavior normal.      No results found for any visits on 05/06/22.     Assessment & Plan:    Routine Health Maintenance and Physical Exam  Immunization History  Administered Date(s) Administered   Influenza Whole 01/02/2009   Influenza, Seasonal, Injecte, Preservative Fre 12/18/2015, 12/11/2016, 12/02/2017, 12/09/2018, 12/10/2019, 12/08/2020   Influenza,inj,Quad PF,6+ Mos 12/18/2015, 12/11/2016, 12/02/2017, 12/09/2018, 12/10/2019, 12/08/2020, 12/28/2021   Influenza-Unspecified 01/01/2015, 12/18/2015, 12/11/2016, 12/02/2017, 12/09/2018, 12/10/2019, 12/08/2020, 12/28/2021   PFIZER(Purple Top)SARS-COV-2 Vaccination 03/11/2019, 03/31/2019, 12/16/2019   Td 01/02/2009   Tdap  01/19/2019    Health Maintenance  Topic Date Due   COLONOSCOPY (Pts 45-41yr Insurance coverage will need to be confirmed)  03/13/2023 (Originally 07/15/2019)   Hepatitis C Screening  03/13/2023 (Originally 07/14/1992)   COVID-19 Vaccine (4 - 2023-24 season) 03/29/2023 (Originally 11/02/2021)   MAMMOGRAM  05/06/2023 (Originally 06/28/2021)   HIV Screening  04/03/2028 (Originally 07/14/1989)   PAP SMEAR-Modifier  01/19/2024   DTaP/Tdap/Td (3 - Td or Tdap) 01/18/2029   INFLUENZA VACCINE  Completed   Pneumococcal Vaccine [id number] old  Aged Out   HPV VACCINES  Aged Out    Discussed health benefits of physical activity, and encouraged her to engage in regular exercise appropriate for her age and condition.  Problem List Items Addressed This Visit   None Visit Diagnoses     Wellness examination    -  Primary   Relevant Orders   Lipid Panel w/reflex Direct LDL   COMPLETE METABOLIC PANEL WITH GFR   CBC   Sebaceous cyst       Screening for malignant neoplasm of colon       Relevant Orders   Cologuard     Keep up a regular exercise program and make sure you are eating a healthy diet Try to eat 4 servings of dairy a day, or if you are lactose intolerant take a calcium with vitamin D daily.  Your vaccines are up to date.   Sebaceous cyst on left labia-we discussed options.  It is benign it is not currently irritated or inflamed.  Recommend referral to GYN for excision.  No follow-ups on file.     CBeatrice Lecher MD

## 2022-06-13 ENCOUNTER — Other Ambulatory Visit: Payer: Self-pay | Admitting: Family Medicine

## 2022-06-13 DIAGNOSIS — F988 Other specified behavioral and emotional disorders with onset usually occurring in childhood and adolescence: Secondary | ICD-10-CM

## 2022-06-14 ENCOUNTER — Encounter: Payer: Self-pay | Admitting: Family Medicine

## 2022-06-16 MED ORDER — METHYLPHENIDATE HCL ER (OSM) 54 MG PO TBCR: 54.0000 mg | EXTENDED_RELEASE_TABLET | ORAL | 0 refills | Status: DC

## 2022-06-16 NOTE — Telephone Encounter (Signed)
Meds ordered this encounter  Medications   methylphenidate (CONCERTA) 54 MG PO CR tablet    Sig: Take 1 tablet (54 mg total) by mouth every morning.    Dispense:  30 tablet    Refill:  0   methylphenidate (CONCERTA) 54 MG PO CR tablet    Sig: Take 1 tablet (54 mg total) by mouth every morning.    Dispense:  30 tablet    Refill:  0   methylphenidate (CONCERTA) 54 MG PO CR tablet    Sig: Take 1 tablet (54 mg total) by mouth every morning.    Dispense:  30 tablet    Refill:  0

## 2022-07-15 LAB — HM MAMMOGRAPHY

## 2022-08-20 ENCOUNTER — Encounter: Payer: Self-pay | Admitting: Family Medicine

## 2022-09-10 ENCOUNTER — Ambulatory Visit: Payer: Managed Care, Other (non HMO) | Admitting: Family Medicine

## 2022-09-10 ENCOUNTER — Encounter: Payer: Self-pay | Admitting: Family Medicine

## 2022-09-10 VITALS — BP 128/86 | HR 89 | Ht 66.0 in | Wt 120.0 lb

## 2022-09-10 DIAGNOSIS — F988 Other specified behavioral and emotional disorders with onset usually occurring in childhood and adolescence: Secondary | ICD-10-CM | POA: Diagnosis not present

## 2022-09-10 DIAGNOSIS — G43001 Migraine without aura, not intractable, with status migrainosus: Secondary | ICD-10-CM

## 2022-09-10 MED ORDER — METHYLPHENIDATE HCL ER (OSM) 54 MG PO TBCR: 54.0000 mg | EXTENDED_RELEASE_TABLET | ORAL | 0 refills | Status: DC

## 2022-09-10 NOTE — Assessment & Plan Note (Signed)
She is actually doing really well on the Topamax.  It has been really controlling her migraines well.  And if she does feel like she is getting 1 she is usually able to take the Maxalt and it completely aborts the headache.  She is really happy with her migraine control right now.

## 2022-09-10 NOTE — Progress Notes (Signed)
   Established Patient Office Visit  Subjective   Patient ID: Monica Santiago, female    DOB: 1976-03-01  Age: 47 y.o. MRN: [id number]  Chief Complaint  Patient presents with   Medication Refill    Concerta     HPI  ADD - Reports symptoms are well controlled on current regime. Denies any problems with insomnia, chest pain, palpitations, or SOB.    She did separate from her husband since I last saw her but feels like overall it is actually been a good thing she has been really doing a lot of self-care she has been eating more healthy she has been working out pretty regularly.  She feels like overall she is got a lot better control of her migraines.  She has lost about 4 pounds since she was last here but she feels good at her current weight and again feels like overall she is really leading a little bit healthier lifestyle.    ROS    Objective:     BP 128/86   Pulse 89   Ht 5\' 6"  (1.676 m)   Wt 120 lb (54.4 kg)   SpO2 100%   BMI 19.37 kg/m    Physical Exam Vitals and nursing note reviewed.  Constitutional:      Appearance: She is well-developed.  HENT:     Head: Normocephalic and atraumatic.  Cardiovascular:     Rate and Rhythm: Normal rate and regular rhythm.     Heart sounds: Normal heart sounds.  Pulmonary:     Effort: Pulmonary effort is normal.     Breath sounds: Normal breath sounds.  Skin:    General: Skin is warm and dry.  Neurological:     Mental Status: She is alert and oriented to person, place, and time.  Psychiatric:        Behavior: Behavior normal.      No results found for any visits on 09/10/22.    The ASCVD Risk score (Arnett DK, et al., 2019) failed to calculate for the following reasons:   Cannot find a previous HDL lab   Cannot find a previous total cholesterol lab    Assessment & Plan:   Problem List Items Addressed This Visit       Cardiovascular and Mediastinum   Migraine without aura and with status migrainosus, not  intractable    She is actually doing really well on the Topamax.  It has been really controlling her migraines well.  And if she does feel like she is getting 1 she is usually able to take the Maxalt and it completely aborts the headache.  She is really happy with her migraine control right now.        Other   Attention deficit disorder (ADD) - Primary    Well controlled. Continue current regimen. Follow up in  14m       Relevant Medications   methylphenidate (CONCERTA) 54 MG PO CR tablet (Start on 09/12/2022)   methylphenidate (CONCERTA) 54 MG PO CR tablet (Start on 10/12/2022)   methylphenidate (CONCERTA) 54 MG PO CR tablet (Start on 11/11/2022)    Return in about 6 months (around 03/13/2023) for ADHD medications .    Nani Gasser, MD

## 2022-09-10 NOTE — Assessment & Plan Note (Signed)
Well controlled. Continue current regimen. Follow up in  6 m  

## 2022-11-14 ENCOUNTER — Other Ambulatory Visit: Payer: Self-pay | Admitting: Family Medicine

## 2022-11-15 ENCOUNTER — Other Ambulatory Visit: Payer: Self-pay | Admitting: *Deleted

## 2022-11-15 MED ORDER — TOPIRAMATE 25 MG PO TABS: ORAL_TABLET | ORAL | 1 refills | Status: DC

## 2022-11-15 NOTE — Telephone Encounter (Signed)
Medication sent to pharmacy

## 2023-01-07 ENCOUNTER — Other Ambulatory Visit: Payer: Self-pay | Admitting: Family Medicine

## 2023-01-07 DIAGNOSIS — F988 Other specified behavioral and emotional disorders with onset usually occurring in childhood and adolescence: Secondary | ICD-10-CM

## 2023-01-08 MED ORDER — METHYLPHENIDATE HCL ER (OSM) 54 MG PO TBCR: 54.0000 mg | EXTENDED_RELEASE_TABLET | ORAL | 0 refills | Status: DC

## 2023-01-08 NOTE — Telephone Encounter (Signed)
Meds ordered this encounter  Medications   methylphenidate (CONCERTA) 54 MG PO CR tablet    Sig: Take 1 tablet (54 mg total) by mouth every morning.    Dispense:  30 tablet    Refill:  0   methylphenidate (CONCERTA) 54 MG PO CR tablet    Sig: Take 1 tablet (54 mg total) by mouth every morning.    Dispense:  30 tablet    Refill:  0   methylphenidate (CONCERTA) 54 MG PO CR tablet    Sig: Take 1 tablet (54 mg total) by mouth every morning.    Dispense:  30 tablet    Refill:  0

## 2023-03-13 ENCOUNTER — Encounter: Payer: Self-pay | Admitting: Family Medicine

## 2023-03-13 ENCOUNTER — Ambulatory Visit: Payer: Managed Care, Other (non HMO) | Admitting: Family Medicine

## 2023-03-13 VITALS — BP 117/81 | HR 82 | Ht 66.0 in | Wt 133.0 lb

## 2023-03-13 DIAGNOSIS — G43001 Migraine without aura, not intractable, with status migrainosus: Secondary | ICD-10-CM

## 2023-03-13 DIAGNOSIS — Z79899 Other long term (current) drug therapy: Secondary | ICD-10-CM

## 2023-03-13 DIAGNOSIS — F988 Other specified behavioral and emotional disorders with onset usually occurring in childhood and adolescence: Secondary | ICD-10-CM | POA: Diagnosis not present

## 2023-03-13 DIAGNOSIS — Z13220 Encounter for screening for lipoid disorders: Secondary | ICD-10-CM | POA: Diagnosis not present

## 2023-03-13 DIAGNOSIS — R41840 Attention and concentration deficit: Secondary | ICD-10-CM

## 2023-03-13 MED ORDER — RIZATRIPTAN BENZOATE 10 MG PO TBDP
10.0000 mg | ORAL_TABLET | ORAL | 4 refills | Status: AC | PRN
Start: 2023-03-13 — End: ?

## 2023-03-13 MED ORDER — METAXALONE 400 MG PO TABS: 400.0000 mg | ORAL_TABLET | Freq: Three times a day (TID) | ORAL | 3 refills | Status: DC

## 2023-03-13 MED ORDER — METHYLPHENIDATE HCL ER (OSM) 54 MG PO TBCR: 54.0000 mg | EXTENDED_RELEASE_TABLET | ORAL | 0 refills | Status: DC

## 2023-03-13 NOTE — Assessment & Plan Note (Signed)
 Well controlled. Continue current regimen. Follow up in  6 mo

## 2023-03-13 NOTE — Progress Notes (Signed)
   Established Patient Office Visit  Subjective  Patient ID: Monica Santiago, female    DOB: 14-Feb-1976  Age: 48 y.o. MRN: [id number]  Chief Complaint  Patient presents with   ADD    HPI  ADD - Reports symptoms are well controlled on current regime. Denies any problems with insomnia, chest pain, palpitations, or SOB.    She has been doing well overall migraines have been really well-controlled on the Topamax .  She rarely takes the Maxalt  but would like a refill.     ROS    Objective:     BP 117/81   Pulse 82   Ht 5' 6 (1.676 m)   Wt 133 lb (60.3 kg)   SpO2 100%   BMI 21.47 kg/m    Physical Exam Vitals and nursing note reviewed.  Constitutional:      Appearance: Normal appearance.  HENT:     Head: Normocephalic and atraumatic.  Eyes:     Conjunctiva/sclera: Conjunctivae normal.  Cardiovascular:     Rate and Rhythm: Normal rate and regular rhythm.  Pulmonary:     Effort: Pulmonary effort is normal.     Breath sounds: Normal breath sounds.  Skin:    General: Skin is warm and dry.  Neurological:     Mental Status: She is alert.  Psychiatric:        Mood and Affect: Mood normal.      No results found for any visits on 03/13/23.    The ASCVD Risk score (Arnett DK, et al., 2019) failed to calculate for the following reasons:   Cannot find a previous HDL lab   Cannot find a previous total cholesterol lab    Assessment & Plan:   Problem List Items Addressed This Visit       Cardiovascular and Mediastinum   Migraine without aura and with status migrainosus, not intractable   Doing well on Topamax  will refill Maxalt  for as needed use as well as muscle relaxer.  Call if any concerns or problems.  Otherwise follow-up in 6 months.      Relevant Medications   metaxalone  (SKELAXIN ) 400 MG tablet   rizatriptan  (MAXALT -MLT) 10 MG disintegrating tablet   Other Relevant Orders   CMP14+EGFR   Lipid panel   CBC     Other   Screening, lipid   You for  screening lipids last time we checked it was in 2021.      Relevant Orders   CMP14+EGFR   Lipid panel   CBC   Attention deficit disorder (ADD)   Well controlled. Continue current regimen. Follow up in  86mo       Relevant Medications   methylphenidate  (CONCERTA ) 54 MG PO CR tablet (Start on 05/04/2023)   methylphenidate  (CONCERTA ) 54 MG PO CR tablet (Start on 04/07/2023)   Other Visit Diagnoses       Attention deficit disorder (ADD) without hyperactivity    -  Primary   Relevant Medications   methylphenidate  (CONCERTA ) 54 MG PO CR tablet (Start on 05/04/2023)   methylphenidate  (CONCERTA ) 54 MG PO CR tablet (Start on 04/07/2023)   Other Relevant Orders   CMP14+EGFR   Lipid panel   CBC     Medication management       Relevant Orders   CMP14+EGFR   Lipid panel   CBC       Return in about 6 months (around 09/10/2023) for ADD meds.    Dorothyann Byars, MD

## 2023-03-13 NOTE — Assessment & Plan Note (Signed)
 You for screening lipids last time we checked it was in 2021.

## 2023-03-13 NOTE — Assessment & Plan Note (Signed)
 Doing well on Topamax will refill Maxalt for as needed use as well as muscle relaxer.  Call if any concerns or problems.  Otherwise follow-up in 6 months.

## 2023-03-14 LAB — CMP14+EGFR
ALT: 12 [IU]/L (ref 0–32)
AST: 14 [IU]/L (ref 0–40)
Albumin: 4.2 g/dL (ref 3.9–4.9)
Alkaline Phosphatase: 43 [IU]/L — ABNORMAL LOW (ref 44–121)
BUN/Creatinine Ratio: 25 — ABNORMAL HIGH (ref 9–23)
BUN: 18 mg/dL (ref 6–24)
Bilirubin Total: 0.5 mg/dL (ref 0.0–1.2)
CO2: 20 mmol/L (ref 20–29)
Calcium: 9.2 mg/dL (ref 8.7–10.2)
Chloride: 107 mmol/L — ABNORMAL HIGH (ref 96–106)
Creatinine, Ser: 0.71 mg/dL (ref 0.57–1.00)
Globulin, Total: 2.4 g/dL (ref 1.5–4.5)
Glucose: 75 mg/dL (ref 70–99)
Potassium: 3.7 mmol/L (ref 3.5–5.2)
Sodium: 142 mmol/L (ref 134–144)
Total Protein: 6.6 g/dL (ref 6.0–8.5)
eGFR: 105 mL/min/{1.73_m2} (ref >59)

## 2023-03-14 LAB — LIPID PANEL
Chol/HDL Ratio: 2.4 {ratio} (ref 0.0–4.4)
Cholesterol, Total: 190 mg/dL (ref 100–199)
HDL: 79 mg/dL (ref >39)
LDL Chol Calc (NIH): 98 mg/dL (ref 0–99)
Triglycerides: 69 mg/dL (ref 0–149)
VLDL Cholesterol Cal: 13 mg/dL (ref 5–40)

## 2023-03-14 LAB — CBC
Hematocrit: 42.8 % (ref 34.0–46.6)
Hemoglobin: 14.3 g/dL (ref 11.1–15.9)
MCH: 31.6 pg (ref 26.6–33.0)
MCHC: 33.4 g/dL (ref 31.5–35.7)
MCV: 95 fL (ref 79–97)
Platelets: 214 10*3/uL (ref 150–450)
RBC: 4.53 x10E6/uL (ref 3.77–5.28)
RDW: 12.3 % (ref 11.7–15.4)
WBC: 5.6 10*3/uL (ref 3.4–10.8)

## 2023-03-14 NOTE — Progress Notes (Signed)
 Hi Dinna, overall labs look okay.

## 2023-03-31 ENCOUNTER — Other Ambulatory Visit: Payer: Self-pay | Admitting: Family Medicine

## 2023-03-31 DIAGNOSIS — Z3041 Encounter for surveillance of contraceptive pills: Secondary | ICD-10-CM

## 2023-06-05 ENCOUNTER — Other Ambulatory Visit: Payer: Self-pay | Admitting: Family Medicine

## 2023-06-26 ENCOUNTER — Other Ambulatory Visit: Payer: Self-pay | Admitting: Family Medicine

## 2023-06-26 DIAGNOSIS — F988 Other specified behavioral and emotional disorders with onset usually occurring in childhood and adolescence: Secondary | ICD-10-CM

## 2023-06-26 MED ORDER — METHYLPHENIDATE HCL ER (OSM) 54 MG PO TBCR: 54.0000 mg | EXTENDED_RELEASE_TABLET | ORAL | 0 refills | Status: DC

## 2023-06-26 NOTE — Telephone Encounter (Signed)
 Requesting rx rf of concerta  54 mg  Last written 05/04/2023 Last OV 01/092025 Upcoming appt 09/12/2023

## 2023-07-23 LAB — HM PAP SMEAR
Chlamydia, Swab/Urine, PCR: NEGATIVE
HPV 16/18/45 genotyping: NEGATIVE

## 2023-07-31 ENCOUNTER — Encounter: Payer: Self-pay | Admitting: Family Medicine

## 2023-07-31 DIAGNOSIS — F988 Other specified behavioral and emotional disorders with onset usually occurring in childhood and adolescence: Secondary | ICD-10-CM

## 2023-08-01 MED ORDER — METHYLPHENIDATE HCL ER (OSM) 54 MG PO TBCR
54.0000 mg | EXTENDED_RELEASE_TABLET | ORAL | 0 refills | Status: DC
Start: 2023-08-29 — End: 2023-11-17

## 2023-08-01 MED ORDER — METHYLPHENIDATE HCL ER (OSM) 54 MG PO TBCR
54.0000 mg | EXTENDED_RELEASE_TABLET | ORAL | 0 refills | Status: DC
Start: 2023-08-01 — End: 2023-08-27

## 2023-08-01 NOTE — Telephone Encounter (Signed)
 Meds ordered this encounter  Medications   methylphenidate  (CONCERTA ) 54 MG PO CR tablet    Sig: Take 1 tablet (54 mg total) by mouth every morning.    Dispense:  30 tablet    Refill:  0   methylphenidate  (CONCERTA ) 54 MG PO CR tablet    Sig: Take 1 tablet (54 mg total) by mouth every morning.    Dispense:  30 tablet    Refill:  0

## 2023-08-27 ENCOUNTER — Encounter: Payer: Self-pay | Admitting: Family Medicine

## 2023-08-27 ENCOUNTER — Ambulatory Visit: Admitting: Family Medicine

## 2023-08-27 VITALS — BP 108/70 | HR 75 | Ht 66.0 in | Wt 136.0 lb

## 2023-08-27 DIAGNOSIS — G43001 Migraine without aura, not intractable, with status migrainosus: Secondary | ICD-10-CM

## 2023-08-27 DIAGNOSIS — F988 Other specified behavioral and emotional disorders with onset usually occurring in childhood and adolescence: Secondary | ICD-10-CM | POA: Diagnosis not present

## 2023-08-27 DIAGNOSIS — R41840 Attention and concentration deficit: Secondary | ICD-10-CM

## 2023-08-27 MED ORDER — METHYLPHENIDATE HCL ER (OSM) 54 MG PO TBCR: 54.0000 mg | EXTENDED_RELEASE_TABLET | ORAL | 0 refills | Status: DC

## 2023-08-27 MED ORDER — METHYLPHENIDATE HCL ER (OSM) 54 MG PO TBCR
54.0000 mg | EXTENDED_RELEASE_TABLET | ORAL | 0 refills | Status: DC
Start: 2023-09-26 — End: 2023-11-17

## 2023-08-27 NOTE — Progress Notes (Signed)
   Established Patient Office Visit  Subjective  Patient ID: Monica Santiago, female    DOB: Jul 12, 1975  Age: 48 y.o. MRN: [id number]  Chief Complaint  Patient presents with   ADD    HPI  6 mo f/u:   ADD - Reports symptoms are well controlled on current regime. Denies any problems with insomnia, chest pain, palpitations, or SOB.    She did go for her gynecologic exam in May at [facility] [place] with [facility]. Having cyst on cervix removed.    Due for colonoscopy.    Follow-up migraine headaches-she has been having a few or more headaches than usual barometric pressure shift is a big trigger for her and we have had a lot of rain and barometric shift lately.  But she says the Maxalt  tends to work well so she just keeps it on her.     ROS    Objective:     BP 108/70   Pulse 75   Ht 5' 6 (1.676 m)   Wt 136 lb 0.6 oz (61.7 kg)   SpO2 100%   BMI 21.96 kg/m    Physical Exam Vitals and nursing note reviewed.  Constitutional:      Appearance: Normal appearance.  HENT:     Head: Normocephalic and atraumatic.   Eyes:     Conjunctiva/sclera: Conjunctivae normal.    Cardiovascular:     Rate and Rhythm: Normal rate and regular rhythm.  Pulmonary:     Effort: Pulmonary effort is normal.     Breath sounds: Normal breath sounds.   Skin:    General: Skin is warm and dry.   Neurological:     Mental Status: She is alert.   Psychiatric:        Mood and Affect: Mood normal.     No results found for any visits on 08/27/23.    The 10-year ASCVD risk score (Arnett DK, et al., 2019) is: 0.5%    Assessment & Plan:   Problem List Items Addressed This Visit       Cardiovascular and Mediastinum   Migraine without aura and with status migrainosus, not intractable   Doing well overall currently on Topamax  and uses Maxalt  for rescue.        Other   Attention deficit disorder (ADD) - Primary   Well controlled. Continue current regimen. Follow up in  75mo  refill sent for July and August she has 1 on file for the end of June.  When she picks up the 1 in August then we can send September October and November.  Otherwise I will see her back in 6 months.      Relevant Medications   methylphenidate  (CONCERTA ) 54 MG PO CR tablet (Start on 09/26/2023)   methylphenidate  (CONCERTA ) 54 MG PO CR tablet (Start on 10/26/2023)   Other Visit Diagnoses       Attention deficit disorder (ADD) without hyperactivity       Relevant Medications   methylphenidate  (CONCERTA ) 54 MG PO CR tablet (Start on 09/26/2023)   methylphenidate  (CONCERTA ) 54 MG PO CR tablet (Start on 10/26/2023)       Return in about 6 months (around 02/26/2024) for ADD.    Dorothyann Byars, MD

## 2023-08-27 NOTE — Assessment & Plan Note (Addendum)
 Well controlled. Continue current regimen. Follow up in  19mo refill sent for July and August she has 1 on file for the end of June.  When she picks up the 1 in August then we can send September October and November.  Otherwise I will see her back in 6 months.

## 2023-08-27 NOTE — Assessment & Plan Note (Signed)
 Doing well overall currently on Topamax  and uses Maxalt  for rescue.

## 2023-09-12 ENCOUNTER — Ambulatory Visit: Payer: Managed Care, Other (non HMO) | Admitting: Family Medicine

## 2023-11-17 ENCOUNTER — Encounter: Payer: Self-pay | Admitting: Family Medicine

## 2023-11-17 DIAGNOSIS — F988 Other specified behavioral and emotional disorders with onset usually occurring in childhood and adolescence: Secondary | ICD-10-CM

## 2023-11-18 MED ORDER — METHYLPHENIDATE HCL ER (OSM) 54 MG PO TBCR: 54.0000 mg | EXTENDED_RELEASE_TABLET | ORAL | 0 refills | Status: DC

## 2023-11-27 ENCOUNTER — Other Ambulatory Visit: Payer: Self-pay | Admitting: Medical Genetics

## 2023-12-18 ENCOUNTER — Telehealth: Payer: Self-pay

## 2023-12-18 NOTE — Telephone Encounter (Signed)
 Left message requesting a call back for colon cancer screening. No documentation of colonoscopy or Cologuard.

## 2023-12-26 ENCOUNTER — Other Ambulatory Visit: Payer: Self-pay | Admitting: *Deleted

## 2023-12-26 MED ORDER — TOPIRAMATE 25 MG PO TABS: ORAL_TABLET | ORAL | 1 refills | Status: AC

## 2024-03-01 ENCOUNTER — Ambulatory Visit: Admitting: Family Medicine

## 2024-03-01 VITALS — BP 122/83 | HR 94 | Ht 66.0 in | Wt 140.0 lb

## 2024-03-01 DIAGNOSIS — F988 Other specified behavioral and emotional disorders with onset usually occurring in childhood and adolescence: Secondary | ICD-10-CM

## 2024-03-01 DIAGNOSIS — Z1211 Encounter for screening for malignant neoplasm of colon: Secondary | ICD-10-CM

## 2024-03-01 MED ORDER — METHYLPHENIDATE HCL ER (OSM) 54 MG PO TBCR: 54.0000 mg | EXTENDED_RELEASE_TABLET | ORAL | 0 refills | Status: AC

## 2024-03-01 NOTE — Progress Notes (Signed)
" ° °  Established Patient Office Visit  Patient ID: Monica Santiago, female    DOB: 02-14-1976  Age: 48 y.o. MRN: [id number] PCP: Alvan Dorothyann BIRCH, MD  Chief Complaint  Patient presents with   ADD    Subjective:     HPI  Discussed the use of AI scribe software for clinical note transcription with the patient, who gave verbal consent to proceed.  History of Present Illness Monica Santiago is a 49 year old female who presents for a routine follow-up visit.  Emotional distress - Coping with the recent loss of two friends over the holidays, one to aggressive breast cancer and another to lung cancer.  Cancer screening and preventive care - Considering colon cancer screening; has had two previous screenings. - Contemplating use of a home test kit for next colon cancer screening. - Received flu vaccination at work. - Pap smear performed in June 2025 was normal.  Medication management - Currently taking Concerta  without adverse effects such as jitteriness or sleep disturbances. - Weight remains stable despite increased intake of holiday treats; increased physical activity to manage weight. - Recent change in pharmacy due to relocation. - Experiencing frustration with current pharmacy's service, particularly regarding medication refills.     ROS    Objective:     BP 122/83   Pulse 94   Ht 5' 6 (1.676 m)   Wt 140 lb (63.5 kg)   SpO2 100%   BMI 22.60 kg/m    Physical Exam Vitals and nursing note reviewed.  Constitutional:      Appearance: Normal appearance.  HENT:     Head: Normocephalic and atraumatic.  Eyes:     Conjunctiva/sclera: Conjunctivae normal.  Cardiovascular:     Rate and Rhythm: Normal rate and regular rhythm.  Pulmonary:     Effort: Pulmonary effort is normal.     Breath sounds: Normal breath sounds.  Skin:    General: Skin is warm and dry.  Neurological:     Mental Status: She is alert.  Psychiatric:        Mood and Affect: Mood normal.       No results found for any visits on 03/01/24.    The 10-year ASCVD risk score (Arnett DK, et al., 2019) is: 0.6%    Assessment & Plan:   Problem List Items Addressed This Visit   None Visit Diagnoses       Screen for colon cancer    -  Primary   Relevant Orders   Cologuard     Attention deficit disorder (ADD) without hyperactivity       Relevant Medications   methylphenidate  (CONCERTA ) 54 MG PO CR tablet   methylphenidate  (CONCERTA ) 54 MG PO CR tablet (Start on 03/29/2024)   methylphenidate  (CONCERTA ) 54 MG PO CR tablet (Start on 04/26/2024)       Assessment and Plan Assessment & Plan Woman's Wellness Visit Discussed colon cancer screening options, emphasizing the importance of regular screenings and vaccinations. Confirmed flu shot and Pap smear are current. - Ordered Cologuard test for colon cancer screening. - Continue routine wellness screenings and vaccinations.  Attention deficit disorder without hyperactivity Condition well-managed on Concerta  with stable weight and no side effects reported. - Continue Concerta  as prescribed.    Return in about 6 months (around 08/30/2024) for ADD.    Dorothyann Alvan, MD [facility] at [facility]   "

## 2024-03-22 ENCOUNTER — Ambulatory Visit: Admitting: Family Medicine

## 2024-03-22 ENCOUNTER — Encounter: Payer: Self-pay | Admitting: Family Medicine

## 2024-03-22 VITALS — BP 126/74 | HR 100 | Ht 66.0 in | Wt 140.0 lb

## 2024-03-22 DIAGNOSIS — N760 Acute vaginitis: Secondary | ICD-10-CM

## 2024-03-22 NOTE — Progress Notes (Signed)
" ° °  Acute Office Visit  Patient ID: Monica Santiago, female    DOB: 04-21-1975, 49 y.o.   MRN: [id number]  PCP: Alvan Dorothyann BIRCH, MD  Chief Complaint  Patient presents with   Vaginitis    Subjective:     HPI  Discussed the use of AI scribe software for clinical note transcription with the patient, who gave verbal consent to proceed.  History of Present Illness NIAYA HICKOK is a 49 year old female who presents with vaginal discomfort and discharge.  Vaginal discomfort and discharge - Mild vaginal discomfort present for approximately three weeks - Change in vaginal discharge noted over the same period - No significant pain, including during intercourse - No pruritus reported - Current partner of eight months noticed change in symptoms  Menstrual irregularity - Menstrual cycles occur every three to four months  Gynecological history - Regular gynecological checkup in May of previous year with no abnormalities identified   ROS     Objective:    BP 126/74   Pulse 100   Ht 5' 6 (1.676 m)   Wt 140 lb (63.5 kg)   SpO2 95%   BMI 22.60 kg/m    Physical Exam Vitals reviewed.  Constitutional:      Appearance: Normal appearance.  HENT:     Head: Normocephalic.  Pulmonary:     Effort: Pulmonary effort is normal.  Neurological:     Mental Status: She is alert and oriented to person, place, and time.  Psychiatric:        Mood and Affect: Mood normal.        Behavior: Behavior normal.       Results for orders placed or performed in visit on 03/22/24  HM PAP SMEAR  Result Value Ref Range   HM Pap smear NILM    HPV 16/18/45 genotyping Negative    Chlamydia, Swab/Urine, PCR Negative        Assessment & Plan:   Problem List Items Addressed This Visit   None Visit Diagnoses       Acute vaginitis    -  Primary   Relevant Orders   NuSwab Vaginitis Plus (VG+)       Assessment and Plan Assessment & Plan Acute vaginitis Discomfort and discharge  for three weeks. Differential includes BV and yeast infection. Possible contributing factors include hormonal changes, frequent intercourse, and use of condoms or spermicides. - Performed wet prep to evaluate for yeast and bacterial causes. - Ordered GC chlamydia test due to new sexual partner. - Instructed on proper swab technique for sample collection.    No orders of the defined types were placed in this encounter.   No follow-ups on file.  Dorothyann Alvan, MD [facility] at [facility]   "

## 2024-03-25 ENCOUNTER — Ambulatory Visit: Payer: Self-pay | Admitting: Family Medicine

## 2024-03-25 LAB — NUSWAB VAGINITIS PLUS (VG+): Candida albicans, NAA: POSITIVE — AB

## 2024-03-25 LAB — SPECIMEN STATUS REPORT

## 2024-03-25 MED ORDER — FLUCONAZOLE 150 MG PO TABS: 150.0000 mg | ORAL_TABLET | Freq: Once | ORAL | 0 refills | Status: AC

## 2024-03-25 NOTE — Progress Notes (Signed)
 Hi [name], sorry it took so long to get this went back it kept coming back piecemeal.  It does look like you have a yeast infection so I am going to send over Diflucan  to get that cleared up.  Let us  know if that does not take care of your symptoms.

## 2024-08-30 ENCOUNTER — Ambulatory Visit: Admitting: Family Medicine
